# Patient Record
Sex: Female | Born: 1950
Health system: Southern US, Community
[De-identification: ages and names within clinical notes are randomized; demographics above are authoritative.]

## PROBLEM LIST (undated history)

## (undated) DIAGNOSIS — F329 Major depressive disorder, single episode, unspecified: Secondary | ICD-10-CM

## (undated) DIAGNOSIS — J302 Other seasonal allergic rhinitis: Secondary | ICD-10-CM

## (undated) DIAGNOSIS — M5136 Other intervertebral disc degeneration, lumbar region: Secondary | ICD-10-CM

## (undated) DIAGNOSIS — D649 Anemia, unspecified: Secondary | ICD-10-CM

## (undated) DIAGNOSIS — F32A Depression, unspecified: Secondary | ICD-10-CM

## (undated) DIAGNOSIS — K219 Gastro-esophageal reflux disease without esophagitis: Secondary | ICD-10-CM

## (undated) DIAGNOSIS — M810 Age-related osteoporosis without current pathological fracture: Secondary | ICD-10-CM

## (undated) DIAGNOSIS — Z5189 Encounter for other specified aftercare: Secondary | ICD-10-CM

## (undated) DIAGNOSIS — M51369 Other intervertebral disc degeneration, lumbar region without mention of lumbar back pain or lower extremity pain: Secondary | ICD-10-CM

## (undated) DIAGNOSIS — T7840XA Allergy, unspecified, initial encounter: Secondary | ICD-10-CM

## (undated) DIAGNOSIS — E7800 Pure hypercholesterolemia, unspecified: Secondary | ICD-10-CM

## (undated) HISTORY — DX: Allergy, unspecified, initial encounter: T78.40XA

## (undated) HISTORY — DX: Major depressive disorder, single episode, unspecified: F32.9

## (undated) HISTORY — DX: Gastro-esophageal reflux disease without esophagitis: K21.9

## (undated) HISTORY — DX: Age-related osteoporosis without current pathological fracture: M81.0

## (undated) HISTORY — DX: Depression, unspecified: F32.A

## (undated) HISTORY — DX: Encounter for other specified aftercare: Z51.89

## (undated) HISTORY — DX: Anemia, unspecified: D64.9

---

## 1981-02-04 HISTORY — PX: ABDOMINAL HYSTERECTOMY: SHX81

## 2000-01-18 ENCOUNTER — Encounter: Payer: Self-pay | Admitting: Neurosurgery

## 2000-01-18 ENCOUNTER — Ambulatory Visit (HOSPITAL_COMMUNITY): Admission: RE | Admit: 2000-01-18 | Discharge: 2000-01-18 | Payer: Self-pay | Admitting: Neurosurgery

## 2002-02-04 HISTORY — PX: CARPAL TUNNEL RELEASE: SHX101

## 2004-04-07 ENCOUNTER — Emergency Department (HOSPITAL_COMMUNITY): Admission: EM | Admit: 2004-04-07 | Discharge: 2004-04-07 | Payer: Self-pay | Admitting: Emergency Medicine

## 2006-02-04 ENCOUNTER — Emergency Department (HOSPITAL_COMMUNITY): Admission: EM | Admit: 2006-02-04 | Discharge: 2006-02-04 | Payer: Self-pay | Admitting: Emergency Medicine

## 2006-02-10 ENCOUNTER — Encounter: Admission: RE | Admit: 2006-02-10 | Discharge: 2006-02-10 | Payer: Self-pay | Admitting: Family Medicine

## 2006-03-06 ENCOUNTER — Ambulatory Visit: Payer: Self-pay | Admitting: Gastroenterology

## 2006-03-17 ENCOUNTER — Ambulatory Visit: Payer: Self-pay | Admitting: Gastroenterology

## 2006-05-14 ENCOUNTER — Emergency Department (HOSPITAL_COMMUNITY): Admission: EM | Admit: 2006-05-14 | Discharge: 2006-05-14 | Payer: Self-pay | Admitting: Emergency Medicine

## 2006-11-27 ENCOUNTER — Emergency Department (HOSPITAL_COMMUNITY): Admission: EM | Admit: 2006-11-27 | Discharge: 2006-11-27 | Payer: Self-pay | Admitting: Emergency Medicine

## 2007-03-22 ENCOUNTER — Emergency Department (HOSPITAL_COMMUNITY): Admission: EM | Admit: 2007-03-22 | Discharge: 2007-03-22 | Payer: Self-pay | Admitting: Emergency Medicine

## 2007-12-04 ENCOUNTER — Encounter
Admission: RE | Admit: 2007-12-04 | Discharge: 2007-12-04 | Payer: Self-pay | Admitting: Physical Medicine & Rehabilitation

## 2007-12-07 ENCOUNTER — Encounter
Admission: RE | Admit: 2007-12-07 | Discharge: 2008-03-06 | Payer: Self-pay | Admitting: Physical Medicine & Rehabilitation

## 2007-12-07 ENCOUNTER — Ambulatory Visit: Payer: Self-pay | Admitting: Physical Medicine & Rehabilitation

## 2007-12-14 ENCOUNTER — Encounter
Admission: RE | Admit: 2007-12-14 | Discharge: 2007-12-14 | Payer: Self-pay | Admitting: Physical Medicine & Rehabilitation

## 2007-12-25 ENCOUNTER — Encounter: Admission: RE | Admit: 2007-12-25 | Discharge: 2007-12-25 | Payer: Self-pay | Admitting: Family Medicine

## 2008-01-04 ENCOUNTER — Ambulatory Visit: Payer: Self-pay | Admitting: Physical Medicine & Rehabilitation

## 2008-02-15 ENCOUNTER — Ambulatory Visit: Payer: Self-pay | Admitting: Physical Medicine & Rehabilitation

## 2008-03-14 ENCOUNTER — Encounter
Admission: RE | Admit: 2008-03-14 | Discharge: 2008-06-12 | Payer: Self-pay | Admitting: Physical Medicine & Rehabilitation

## 2008-03-15 ENCOUNTER — Ambulatory Visit: Payer: Self-pay | Admitting: Physical Medicine & Rehabilitation

## 2008-04-08 ENCOUNTER — Encounter
Admission: RE | Admit: 2008-04-08 | Discharge: 2008-04-11 | Payer: Self-pay | Admitting: Physical Medicine & Rehabilitation

## 2008-04-11 ENCOUNTER — Ambulatory Visit: Payer: Self-pay | Admitting: Physical Medicine & Rehabilitation

## 2008-07-27 ENCOUNTER — Encounter
Admission: RE | Admit: 2008-07-27 | Discharge: 2008-07-27 | Payer: Self-pay | Admitting: Physical Medicine & Rehabilitation

## 2008-10-14 ENCOUNTER — Encounter
Admission: RE | Admit: 2008-10-14 | Discharge: 2008-10-14 | Payer: Self-pay | Admitting: Physical Medicine & Rehabilitation

## 2009-01-16 ENCOUNTER — Encounter: Admission: RE | Admit: 2009-01-16 | Discharge: 2009-01-16 | Payer: Self-pay | Admitting: Family Medicine

## 2009-04-09 ENCOUNTER — Emergency Department (HOSPITAL_COMMUNITY): Admission: EM | Admit: 2009-04-09 | Discharge: 2009-04-09 | Payer: Self-pay | Admitting: Emergency Medicine

## 2009-05-26 ENCOUNTER — Emergency Department (HOSPITAL_COMMUNITY): Admission: EM | Admit: 2009-05-26 | Discharge: 2009-05-26 | Payer: Self-pay | Admitting: Emergency Medicine

## 2010-02-15 ENCOUNTER — Encounter
Admission: RE | Admit: 2010-02-15 | Discharge: 2010-02-15 | Payer: Self-pay | Source: Home / Self Care | Attending: Family Medicine | Admitting: Family Medicine

## 2010-06-19 NOTE — Procedures (Signed)
NAMEJAYLEANA, Suzanne Vasquez                  ACCOUNT NO.:  [id number]   MEDICAL RECORD NO.:  [id number]           PATIENT TYPE:   LOCATION:                                 FACILITY:   PHYSICIAN:  Erick Colace, M.D.DATE OF BIRTH:  15-Aug-1950   DATE OF PROCEDURE:  01/04/2008  DATE OF DISCHARGE:                               OPERATIVE REPORT   PROCEDURE:  L5-S1 translaminar lumbar epidural steroid injection under  fluoroscopic guidance.   INDICATION:  Lumbar pain with spinal stenosis, degenerative disk  disease.  Most involved levels are L3-L4 and L5-S1.   This is L5-S1 translaminar.  Pain is only partially responsive to  medication management including narcotic analgesics and interferes with  self-care mobility.   Informed consent was obtained after describing the risks and benefits of  the procedure with the patient including bleeding, bruising, infection.  She elects to proceed and has given written consent.  The patient placed  prone on fluoroscopy table.  Betadine prep, sterilely draped.  A 25-  gauge, 1-1/2-inch needle was used to anesthetize the skin and subcu  tissue, 1% lidocaine x2 mL.  Then, an 18-gauge introducer needle was  inserted under fluoroscopic guidance into the L5-S1 interlaminar space.  AP lateral imaging utilized.  Omnipaque 180 x 1 mL demonstrate good  epidural spread followed by injection of 2 mL of 1% MPF lidocaine, 2 mL  of 40 mg/mL Depo-Medrol, and 1 mL of 0.9 normal saline.  The patient  tolerated the procedure well.  Pre-injection pain level 10/10.  Post  injection 0/10.      Erick Colace, M.D.  Electronically Signed     AEK/MEDQ  D:  01/04/2008 16:46:09  T:  01/05/2008 06:04:03  Job:  [id number]

## 2010-06-19 NOTE — Assessment & Plan Note (Signed)
A 60 year old female with 8- to 9-year history of lumbar pain, has not  worked since 2002 due to workers' comp injury.  She had a CT myelogram  showing mild facet hypertrophy L3-4, L4-5, L5-S1.  No evidence of  stenosis.  She has had appropriate UDS.   Her meds include:  1. Lyrica 150 b.i.d.  2. Oxycodone 10/325 nightly.  3. Diazepam 2 mg b.i.d.  4. Cymbalta 60 mg per day.  5. I have placed her on tramadol 50 mg t.i.d.   MRI repeated in December 14, 2007, showed a grade 1 slip L4 and L5.   She has done very well with epidural steroid injection at L5-S1 level.  She indicates that she has actually felt the best she has in many years.  Her gait is improved.  Her pain does still flare-up, some with standing.  Her pain is down to 3-4/10 range.   PHYSICAL EXAMINATION:  VITAL SIGNS:  Her blood pressure is 121/81, pulse  89, respirations 18, O2 sat 100% on room air.  GENERAL:  No acute distress.  Orientation x3.  She is able to toe walk  and heel walk.  BACK:  Her lumbar spine range of motion is 75% forward flexion and 50%  extension.  EXTREMITIES:  Her lower extremity strength is normal.   IMPRESSION:  Lumbar spondylosis with degenerative disk, improved after  epidural steroid injection, is doing well on a combination of tramadol  50 mg t.i.d. and oxycodone 10/325 nightly.   At this point, I cannot predict how long her epidural will last her.  Certainly, I would like to get at least 3 months out of it.  She is  about a month and half into it.  We will check her another month to see  if it is wearing off yet and then reschedule if need be.   Oswestry disability scale today 35%.      Erick Colace, M.D.  Electronically Signed     AEK/MedQ  D:  02/15/2008 10:49:26  T:  02/16/2008 01:27:52  Job #:  [id number]

## 2010-06-19 NOTE — Assessment & Plan Note (Signed)
HISTORY:  A 60 year old female with 8-9 year history of lumbar pain.  She has not worked since 2002 due to Workers Comp injury.  She has had  CT myelogram in the past showing mild facet hypertrophy at L3-L4, L4-L5,  L5-S1 level, but no evidence of central, foraminal, or lateral recess  stenosis.  She has been on methadone in the past and in fact was on it  when we first saw her, but did not show up in her system and Elfriede stated  that it was about a week prior to the test that she last took it.  The  oxycodone was positive as expected as was the metabolite oxymorphone.  No illegal drugs were detected.   CURRENT MEDICATIONS:  1. Lyrica 150 mg b.i.d.  2. Oxycodone 110/325 at night.  3. Diazepam 2 mg b.i.d.  4. Cymbalta 60 mg a day.  5. Fexofenadine 180 mg a day.  6. She has been placed on tramadol 50 mg t.i.d.   MRI performed on December 14, 2007.  This was reviewed with the patient  and this has shown prominent facet joint changes at L4-L5 with a grade 1  slip L4 on L5, moderate bulge resulting in moderate spinal stenosis at  that level.  She had mild facet joint changes at L3-L4 and L5-S1 with  mild stenosis at each of those levels, remaining levels were  unremarkable.  I discussed this result with the patient using a model to  help illustrate.   PHYSICAL EXAMINATION:  GENERAL:  In no acute distress.  Mood and affect  appropriate.  Gait is normal.  Lower extremities have normal strength.  Oriented x3.   IMPRESSION:  Lumbar pain, appears to be a combination of spondylosis  with degenerative disk.   PLAN:  We will continue her tramadol during the day 50 mg t.i.d., start  oxycodone 10/325 mg bedtime, keep her off the methadone.  Two epidural  steroid injection today.      Erick Colace, M.D.  Electronically Signed     AEK/MedQ  D:  01/04/2008 16:49:45  T:  01/05/2008 09:45:53  Job #:  [id number]

## 2010-06-19 NOTE — Procedures (Signed)
Suzanne Vasquez, Suzanne Vasquez                  ACCOUNT NO.:  [id number]   MEDICAL RECORD NO.:  [id number]          PATIENT TYPE:  REC   LOCATION:  [facility]                          FACILITY:  [facility]   PHYSICIAN:  Erick Colace, M.D.DATE OF BIRTH:  15-Feb-1950   DATE OF PROCEDURE:  DATE OF DISCHARGE:                               OPERATIVE REPORT   Procedure is L5-S1 translaminar lumbar epidural steroid injection under  fluoroscopic guidance.   Indication is for lumbar radiculopathy.  Prior good results with similar  injection performed about 3 months ago.   Informed consent was obtained after describing risks and benefits of the  procedure with the patient.  These include bleeding, bruising, and  infection.  She elects to proceed and has given written consent.  The  patient was placed prone on fluoroscopy table.  Betadine prep, sterile  drape.  A 25-gauge inch and a half needle was used to anesthetize the  skin and subcutaneous tissue with 1% lidocaine x2 mL.  Then, an 18-gauge  Tuohy needle was inserted under fluoroscopic guidance in the L5-S1  interlaminar space.  AP and lateral images utilized.  Under lateral  imaging once we got to the posterior elements, switched to loss-of-  resistance technique once again advancing under lateral with constant  plunger pressure obtained good loss of resistance at around 7 cm  followed by injection of 2 mL of Omnipaque 180 under live fluoro  demonstrating good epidural spread followed by injection of 2 mL of 40  mg/mL Depo-Medrol plus 2 mL of 1% MPF lidocaine.  The patient tolerated  the procedure well.  Pre and postinjection vitals stable.  Postinjection  instructions given.  We will see her back in 3 month.  Switched her from  oxycodone to hydrocodone 10 mg nightly, so she does not need to come in  for written prescription, #40 per a month.  She occasionally takes an  extra one at night.  We will continue her tramadol 50 t.i.d.      Erick Colace, M.D.  Electronically Signed     AEK/MEDQ  D:  04/11/2008 16:59:07  T:  04/12/2008 04:13:37  Job:  [id number]   cc:   Tyrone Sage

## 2010-06-19 NOTE — Group Therapy Note (Signed)
CONSULT REQUESTED BY:  Dr. Tyrone Sage.   Consult requested for the evaluation of low back pain.   CHIEF COMPLAINT:  Low back pain.   A 60 year old female with onset of pain sometime in 2001 or 2002.  She  states that she has not worked since 2002 because of a Workers Comp Act  injury.  She states that that claim has been settled.  She had a lumbar  CT myelogram on January 18, 2000, demonstrating mild facet hypertrophy  at L3-L4, L4-L5, L5-S1 levels.  No evidence of central or foraminal or  lateral recess stenosis.  She has had no further advanced imaging  studies in that time.  She has had progression of pain.  Her average  pain is 10/10, constant and aching; interferes with activity at a  moderate-to-severe level.  Her pain is worse with walking and standing,  improves with some stretching exercises as well as some therapy  exercises, but she has obtained these from a friend rather than from a  physical therapist.  Her relief from meds is good.  She can walk 5 to 10  minutes at a time and drive.  She has a goal of coming off of at least  some of her pain medicines because they make her to feel dopey..   CURRENT PAIN MEDICATIONS:  Oxycodone 10/325 one per day, methadone 10 mg  one-half p.o. t.i.d., and Lyrica 150 mg p.o. p.r.n. as well as diazepam  2 mg b.i.d. p.r.n.  She is on Cymbalta 60 mg a day.   Other medications include fexofenadine 180 mg a day.   REVIEW OF SYSTEMS:  Positive for trouble walking, spasms, depression,  and anxiety.   SOCIAL HISTORY:  Single.  Son lives with the patient.   FAMILY HISTORY:  Diabetes and high blood pressure.   HABITS:  Denies smoking or drinking or illegal drug use.   PHYSICAL EXAMINATION:  VITAL SIGNS:  Blood pressure 117/75, pulse 70,  respirations 18, and O2 sat 97% on room air.  GENERAL:  A well-developed, well-nourished female in no acute distress.  Orientation x3.  Affect is alert.  MUSCULOSKELETAL/NEUROLOGIC:  Gait is normal.  She  is able to toe walk  and heel walk.  Extremities without edema.  Coordination in the upper  and lower extremities normal.  Deep tendon reflexes are normal, upper  and lower extremities.  Sensation is normal, upper and lower  extremities.  Motor strength is 5/5 bilaterally at deltoid, biceps,  triceps, grips, as well as hip flexion, knee extension, and ankle  dorsiflexion.  Neck range of motion is full.  Lumbar spine range of  motion is 50% forward flexion and 25% extension.  Extension is more  painful than flexion.  Upper and lower extremity range of motion is  normal.   IMPRESSION:  Low-back pain, axial.  Her last imaging study did show  facet arthropathy.  I do think that lumbosacral spondylosis is likely  etiology of her pain rather than disk given her age and previous  results.  However, given the period of time that has gone by since her  last imaging study would need to repeat prior to any type of lumbar  spine injections.  She did state that she has had good results in the  past with epidural injections, and overall goal is to reduce medications  and increase activity levels after we get her pain under better control  with the use of injections and adjust her medications so that she  may be  able to come off her methadone.  We will send her to some physical  therapy and see if this can optimize her functional status.   The treatment plan will discussed with the patient.  We will need to get  a urine drug screen prior to implementing any narcotic analgesics and  get an opioid consent signed.   Thank you for this interesting consultation.  I will keep you apprised  of her progress.      Erick Colace, M.D.  Electronically Signed     AEK/MedQ  D:  12/07/2007 11:20:04  T:  12/08/2007 01:11:58  Job #:  [id number]

## 2010-06-19 NOTE — Assessment & Plan Note (Signed)
Suzanne Vasquez is a 60 year old female with lumbar pain for 8-9 years.  CT  myelogram showed some facet hypertrophy at L3-4, L4-5, L5-S1.  MRI,  November 2009, showed grade 1 slip L4-5 due to degenerative facet  disease.  Did well with epidural steroid injection L5-S1 performed  January 04, 2008.  She feels like this is now starting to wear off.   MEDICATIONS:  1. Lyrica 150 mg b.i.d.  2. Oxycodone 10/325 at night, has some days where she feels like she      could use another one.  3. Diazepam 2 mg b.i.d. prescribed by a primary care.  4. Cymbalta 60 mg a day.  5. Tramadol 50 t.i.d.  She has had no signs of serotonin syndrome.   PHYSICAL EXAMINATION:  GENERAL:  No acute stress.  Mood and affect  appropriate.  Orientation x3.  She is able to heel walk, toe walk and  has 75% forward flexion and 50% extension lumbar range of motion.  She  has no signs of scoliosis.  Extremities:  Normal strength bilateral  lower.  Deep tendon reflexes are mildly reduced right patellar compared  to the left and equal at the ankles.   IMPRESSION:  1. Lumbar spondylosis due to degenerative disk with radicular symptoms      improved after epidural steroid injection.  We will repeat next      month.  2. We will slightly increase her oxycodone to 40 tablets per month so      some days she can take 2.  3. Continue tramadol 50 p.o. q.i.d.      Erick Colace, M.D.  Electronically Signed     AEK/MedQ  D:  03/15/2008 09:15:11  T:  03/15/2008 11:11:52  Job #:  [id number]

## 2010-07-19 ENCOUNTER — Encounter: Payer: Self-pay | Admitting: Cardiovascular Disease

## 2010-08-06 ENCOUNTER — Emergency Department (HOSPITAL_COMMUNITY)
Admission: EM | Admit: 2010-08-06 | Discharge: 2010-08-06 | Disposition: A | Discharge status: Home / Self Care | Payer: No Typology Code available for payment source | Attending: Emergency Medicine | Admitting: Emergency Medicine

## 2010-08-06 DIAGNOSIS — M545 Low back pain, unspecified: Secondary | ICD-10-CM | POA: Insufficient documentation

## 2010-08-06 DIAGNOSIS — M542 Cervicalgia: Secondary | ICD-10-CM | POA: Insufficient documentation

## 2010-08-06 DIAGNOSIS — R51 Headache: Secondary | ICD-10-CM | POA: Insufficient documentation

## 2010-08-06 DIAGNOSIS — T148XXA Other injury of unspecified body region, initial encounter: Secondary | ICD-10-CM | POA: Insufficient documentation

## 2010-08-06 DIAGNOSIS — IMO0002 Reserved for concepts with insufficient information to code with codable children: Secondary | ICD-10-CM | POA: Insufficient documentation

## 2010-11-14 LAB — URINALYSIS, ROUTINE W REFLEX MICROSCOPIC
Bilirubin Urine: NEGATIVE
Glucose, UA: NEGATIVE
Nitrite: POSITIVE — AB
Protein, ur: 30 — AB
Urobilinogen, UA: 0.2
pH: 5.5

## 2010-11-14 LAB — URINE MICROSCOPIC-ADD ON

## 2011-03-11 ENCOUNTER — Other Ambulatory Visit: Payer: Self-pay | Admitting: Family Medicine

## 2011-03-11 DIAGNOSIS — Z1231 Encounter for screening mammogram for malignant neoplasm of breast: Secondary | ICD-10-CM

## 2011-03-21 ENCOUNTER — Ambulatory Visit
Admission: RE | Admit: 2011-03-21 | Discharge: 2011-03-21 | Disposition: A | Discharge status: Home / Self Care | Payer: Medicare Other | Source: Ambulatory Visit | Attending: Family Medicine | Admitting: Family Medicine

## 2011-03-21 DIAGNOSIS — Z1231 Encounter for screening mammogram for malignant neoplasm of breast: Secondary | ICD-10-CM

## 2012-03-23 ENCOUNTER — Other Ambulatory Visit: Payer: Self-pay | Admitting: Family Medicine

## 2012-03-23 DIAGNOSIS — Z1231 Encounter for screening mammogram for malignant neoplasm of breast: Secondary | ICD-10-CM

## 2012-04-21 ENCOUNTER — Ambulatory Visit: Payer: Medicare Other

## 2012-05-20 ENCOUNTER — Ambulatory Visit
Admission: RE | Admit: 2012-05-20 | Discharge: 2012-05-20 | Disposition: A | Discharge status: Home / Self Care | Payer: Medicare HMO | Source: Ambulatory Visit | Attending: Family Medicine | Admitting: Family Medicine

## 2012-05-20 DIAGNOSIS — Z1231 Encounter for screening mammogram for malignant neoplasm of breast: Secondary | ICD-10-CM

## 2013-01-09 ENCOUNTER — Emergency Department (HOSPITAL_COMMUNITY)
Admission: EM | Admit: 2013-01-09 | Discharge: 2013-01-09 | Disposition: A | Discharge status: Home / Self Care | Payer: Medicare HMO | Attending: Emergency Medicine | Admitting: Emergency Medicine

## 2013-01-09 ENCOUNTER — Encounter (HOSPITAL_COMMUNITY): Payer: Self-pay | Admitting: Emergency Medicine

## 2013-01-09 ENCOUNTER — Emergency Department (HOSPITAL_COMMUNITY): Payer: Medicare HMO

## 2013-01-09 DIAGNOSIS — Z8639 Personal history of other endocrine, nutritional and metabolic disease: Secondary | ICD-10-CM | POA: Insufficient documentation

## 2013-01-09 DIAGNOSIS — R079 Chest pain, unspecified: Secondary | ICD-10-CM

## 2013-01-09 DIAGNOSIS — Z862 Personal history of diseases of the blood and blood-forming organs and certain disorders involving the immune mechanism: Secondary | ICD-10-CM | POA: Insufficient documentation

## 2013-01-09 HISTORY — DX: Pure hypercholesterolemia, unspecified: E78.00

## 2013-01-09 LAB — BASIC METABOLIC PANEL
BUN: 8 mg/dL (ref 6–23)
CO2: 28 mEq/L (ref 19–32)
Chloride: 97 mEq/L (ref 96–112)
Creatinine, Ser: 0.7 mg/dL (ref 0.50–1.10)
GFR calc non Af Amer: >90 mL/min (ref >90)
Glucose, Bld: 123 mg/dL — ABNORMAL HIGH (ref 70–99)

## 2013-01-09 LAB — POCT I-STAT TROPONIN I: Troponin i, poc: 0.01 ng/mL (ref 0.00–0.08)

## 2013-01-09 LAB — CBC
MCH: 26.7 pg (ref 26.0–34.0)
RDW: 13.9 % (ref 11.5–15.5)

## 2013-01-09 MED ORDER — IBUPROFEN 200 MG PO TABS: 400.0000 mg | ORAL_TABLET | Freq: Once | ORAL | Status: DC

## 2013-01-09 NOTE — ED Notes (Signed)
Pt reports developing right rib pain early this morning. Pt reports that she also has had nausea and emesis. Pt denies injury, falling, or a cough. Pt is A/O x4, oxygen saturation is 98% on room air, patient speaking in full sentences, NAD.

## 2013-01-09 NOTE — ED Notes (Signed)
Pt is A&O x3. In no acute distress.  Pt c/o pain 5/10 in right rib cage, denies nausea, states she feels hungry now.

## 2013-01-09 NOTE — ED Provider Notes (Signed)
CSN: [id number]     Arrival date & time 01/09/13  1911 History   First MD Initiated Contact with Patient 01/09/13 2017     Chief Complaint  Patient presents with  . Chest Pain   (Consider location/radiation/quality/duration/timing/severity/associated sxs/prior Treatment) Patient is a 62 y.o. female presenting with chest pain. The history is provided by the patient.  Chest Pain Associated symptoms: no abdominal pain, no back pain, no cough, no fever, no headache, no nausea and no shortness of breath   pt c/o localized pain to small area right lateral chest onset awoke w this morning. Pt states wondered whether may have slept wrong. Pain constant, dull, mild non radiating. No specific exacerbating or alleviating factors. No associated nv, diaphoresis or sob. At rest. No change w activity or exertion. No change w position change, or whether upright or supine. No pleuritic pain. No unusual fatigue or doe. Denies personal or family hx cad. No hx lung disease. Denies personal/fam hx gallstones. Pt denies change w eating. Normal appetite today. No radiation of pain or back pain. Pt states exercises regularly, no symptoms w recent exercise. Pt denies injury or strain to area. No leg pain or swelling. No hx dvt or pe. No recent surgery, travel, trauma, or immobility. Non smoker. No drug use. No cough, uri c/o, or hemoptysis.      Past Medical History  Diagnosis Date  . Hypercholesteremia    Past Surgical History  Procedure Laterality Date  . Abdominal hysterectomy    . Carpal tunnel release     No family history on file. History  Substance Use Topics  . Smoking status: Never Smoker   . Smokeless tobacco: Never Used  . Alcohol Use: No   OB History   Grav Para Term Preterm Abortions TAB SAB Ect Mult Living                 Review of Systems  Constitutional: Negative for fever and chills.  HENT: Negative for sore throat.   Eyes: Negative for redness.  Respiratory: Negative for cough and  shortness of breath.   Cardiovascular: Positive for chest pain.  Gastrointestinal: Negative for nausea and abdominal pain.  Genitourinary: Negative for flank pain.  Musculoskeletal: Negative for back pain and neck pain.  Skin: Negative for rash.  Neurological: Negative for headaches.  Hematological: Does not bruise/bleed easily.  Psychiatric/Behavioral: Negative for confusion.    Allergies  Review of patient's allergies indicates no known allergies.  Home Medications  No current outpatient prescriptions on file. BP 151/90  Pulse 81  Temp(Src) 99.6 F (37.6 C) (Oral)  Resp 14  SpO2 100% Physical Exam  Nursing note and vitals reviewed. Constitutional: She is oriented to person, place, and time. She appears well-developed and well-nourished. No distress.  HENT:  Head: Atraumatic.  Eyes: Conjunctivae are normal. No scleral icterus.  Neck: Neck supple. No tracheal deviation present.  Cardiovascular: Normal rate, regular rhythm, normal heart sounds and intact distal pulses.  Exam reveals no gallop and no friction rub.   No murmur heard. Pulmonary/Chest: Effort normal and breath sounds normal. No respiratory distress. She exhibits no tenderness.  Abdominal: Soft. Normal appearance and bowel sounds are normal. She exhibits no distension and no mass. There is no tenderness. There is no rebound and no guarding.  Genitourinary:  No cva tenderness  Musculoskeletal: Normal range of motion. She exhibits no edema and no tenderness.  Neurological: She is alert and oriented to person, place, and time.  Skin: Skin is warm  and dry. No rash noted.  Psychiatric: She has a normal mood and affect.    ED Course  Procedures (including critical care time)  Results for orders placed during the hospital encounter of 01/09/13  BASIC METABOLIC PANEL      Result Value Range   Sodium 136  135 - 145 mEq/L   Potassium 3.5  3.5 - 5.1 mEq/L   Chloride 97  96 - 112 mEq/L   CO2 28  19 - 32 mEq/L    Glucose, Bld 123 (*) 70 - 99 mg/dL   BUN 8  6 - 23 mg/dL   Creatinine, Ser 1.61  0.50 - 1.10 mg/dL   Calcium 09.6  8.4 - 04.5 mg/dL   GFR calc non Af Amer >90  >90 mL/min   GFR calc Af Amer >90  >90 mL/min  CBC      Result Value Range   WBC 6.7  4.0 - 10.5 K/uL   RBC 5.28 (*) 3.87 - 5.11 MIL/uL   Hemoglobin 14.1  12.0 - 15.0 g/dL   HCT 40.9  81.1 - 91.4 %   MCV 80.1  78.0 - 100.0 fL   MCH 26.7  26.0 - 34.0 pg   MCHC 33.3  30.0 - 36.0 g/dL   RDW 78.2  95.6 - 21.3 %   Platelets 198  150 - 400 K/uL  POCT I-STAT TROPONIN I      Result Value Range   Troponin i, poc 0.01  0.00 - 0.08 ng/mL   Comment 3            Dg Chest 2 View  01/09/2013   CLINICAL DATA:  Chest pain  EXAM: CHEST  2 VIEW  COMPARISON:  05/26/2009  FINDINGS: The heart size and mediastinal contours are within normal limits. Both lungs are clear. The visualized skeletal structures are unremarkable.  IMPRESSION: No active cardiopulmonary disease.   Electronically Signed   By: Natasha Mead M.D.   On: 01/09/2013 20:45      EKG Interpretation    Date/Time:  Saturday January 09 2013 19:41:21 EST Ventricular Rate:  82 PR Interval:  123 QRS Duration: 76 QT Interval:  367 QTC Calculation: 429 R Axis:   52 Text Interpretation:  Sinus rhythm No previous tracing Confirmed by Valory Wetherby  MD, Bryten Maher ([id number]) on 01/09/2013 8:34:50 PM            MDM  Iv ns. Labs. Ecg. Cxr.  Reviewed nursing notes and prior charts for additional history.   After constant symptoms x > 14 hours, trop neg.  Pts pain/symptoms felt not c/w acs.   abd soft nt.  Hr 80, rr 14, pulse ox 99% room air. No pleuritic pain, sob or doe. No fever/cough or infil on cxr.  Pt appears comfortable and stable for d/c.       Suzi Roots, MD 01/09/13 [phone]

## 2013-05-05 ENCOUNTER — Other Ambulatory Visit: Payer: Self-pay

## 2013-05-05 DIAGNOSIS — Z1231 Encounter for screening mammogram for malignant neoplasm of breast: Secondary | ICD-10-CM

## 2013-05-24 ENCOUNTER — Ambulatory Visit
Admission: RE | Admit: 2013-05-24 | Discharge: 2013-05-24 | Disposition: A | Discharge status: Home / Self Care | Payer: Commercial Managed Care - HMO | Source: Ambulatory Visit

## 2013-05-24 DIAGNOSIS — Z1231 Encounter for screening mammogram for malignant neoplasm of breast: Secondary | ICD-10-CM

## 2013-11-19 ENCOUNTER — Other Ambulatory Visit: Payer: Self-pay

## 2014-02-17 ENCOUNTER — Telehealth: Payer: Self-pay | Admitting: *Deleted

## 2014-02-17 NOTE — Telephone Encounter (Signed)
per pt her doctor told her to hold off on PT for now...td

## 2014-02-18 ENCOUNTER — Ambulatory Visit: Payer: Commercial Managed Care - HMO | Admitting: Physical Therapy

## 2014-02-28 DIAGNOSIS — E559 Vitamin D deficiency, unspecified: Secondary | ICD-10-CM | POA: Diagnosis not present

## 2014-02-28 DIAGNOSIS — M5380 Other specified dorsopathies, site unspecified: Secondary | ICD-10-CM | POA: Diagnosis not present

## 2014-02-28 DIAGNOSIS — R7309 Other abnormal glucose: Secondary | ICD-10-CM | POA: Diagnosis not present

## 2014-04-13 ENCOUNTER — Ambulatory Visit: Payer: Commercial Managed Care - HMO | Admitting: Physical Therapy

## 2014-04-25 ENCOUNTER — Ambulatory Visit: Payer: Commercial Managed Care - HMO | Attending: Family Medicine | Admitting: Physical Therapy

## 2014-04-25 DIAGNOSIS — M2569 Stiffness of other specified joint, not elsewhere classified: Secondary | ICD-10-CM

## 2014-04-25 DIAGNOSIS — R29898 Other symptoms and signs involving the musculoskeletal system: Secondary | ICD-10-CM | POA: Insufficient documentation

## 2014-04-25 DIAGNOSIS — M24652 Ankylosis, left hip: Secondary | ICD-10-CM | POA: Diagnosis not present

## 2014-04-25 DIAGNOSIS — M545 Low back pain: Secondary | ICD-10-CM

## 2014-04-25 DIAGNOSIS — M2560 Stiffness of unspecified joint, not elsewhere classified: Secondary | ICD-10-CM

## 2014-04-25 DIAGNOSIS — R269 Unspecified abnormalities of gait and mobility: Secondary | ICD-10-CM | POA: Diagnosis not present

## 2014-04-25 NOTE — Therapy (Addendum)
[place], [place], [address] Phone: [phone]   Fax:  [phone]  Physical Therapy Evaluation  Patient Details  Name: Suzanne Vasquez MRN: [id number] Date of Birth: 01/18/1951 Referring Provider:  Denton Lank, MD  Encounter Date: 04/25/2014      PT End of Session - 04/25/14 1451    Visit Number 1   Number of Visits 16   Date for PT Re-Evaluation 06/25/14   PT Start Time 1330   PT Stop Time 1415   PT Time Calculation (min) 45 min   Activity Tolerance Patient tolerated treatment well   Behavior During Therapy [facility] for tasks assessed/performed      Past Medical History  Diagnosis Date  . Hypercholesteremia     Past Surgical History  Procedure Laterality Date  . Abdominal hysterectomy    . Carpal tunnel release      There were no vitals filed for this visit.  Visit Diagnosis:  Left low back pain, with sciatica presence unspecified - Plan: PT plan of care cert/re-cert  Decreased ROM of trunk and back - Plan: PT plan of care cert/re-cert  Abnormal gait - Plan: PT plan of care cert/re-cert  Decreased range of hip movement, left - Plan: PT plan of care cert/re-cert  G-code: Changing & maintaining Body Position Current status: CL Goal Status: CK      Subjective Assessment - 04/25/14 1334    Symptoms pt is a 64 y.o f with CC of pain going to her left knee and lateral shin pain that is worse at night and occasionally at the day with report of numbness and tingling that has been going on since December with the symptoms getting worse.    Currently in Pain? Yes   Pain Score 4    Pain Location Leg   Pain Orientation Left;Lateral   Pain Descriptors / Indicators Tingling;Aching   Pain Type Chronic pain   Pain Onset More than a month ago   Pain Frequency Intermittent   Aggravating Factors  laying down,    Pain Relieving Factors ibuprofen, oxycodone.    Effect of Pain on Daily Activities no   Multiple Pain Sites No            [facility] PT Assessment - 04/25/14 0001    Assessment   Medical Diagnosis Degenerative disc disease   Onset Date 01/11/14   Next MD Visit no scheduled appointment  make an appointment PRN   Prior Therapy no   Precautions   Precautions None   Precaution Comments pt self limites no lifting over 25   Restrictions   Weight Bearing Restrictions No   Balance Screen   Has the patient fallen in the past 6 months No   [place] Private residence   Living Arrangements Alone   Type of [place] One level   [place] --  exercise bike   Prior Function   Level of Independence Independent with basic ADLs;Independent with homemaking with ambulation;Independent with gait;Independent with transfers   Vocation Part time employment;Works at home   [employer] prolong sitting/ companionships   Leisure watching children activities, exercising   ROM / Strength   AROM / PROM / Strength AROM;Strength   AROM   AROM Assessment Site Lumbar;Hip   Left Hip Flexion 48  48 with SLR, 110 with knee bent   Left Hip ABduction 45   Lumbar Flexion 62   Lumbar Extension  10   Lumbar - Right Side Bend 50%  with symptoms down to the left   Lumbar - Left Side Bend 50%  no pain    Lumbar - Right Rotation 25%   Lumbar - Left Rotation 25%  some soreness at end range   Flexibility   Soft Tissue Assessment /Muscle Length yes   Hamstrings 48  with pain at end range on L   Palpation   Palpation tenderness at the Left posterior hip musculature and  piriformis.    Special Tests    Special Tests Lumbar   Slump test   Findings Positive   Side Left   Straight Leg Raise   Findings Positive   Side  Left   Comment --  ipsilateral pain, negative XSLR   Bed Mobility   Bed Mobility Supine to Sit   Supine to Sit 5: Supervision;Other (comment)  increased time and difficulty due to pain   Ambulation/Gait   Ambulation/Gait  Yes   Gait Pattern Decreased stride length;Antalgic;Narrow base of support;Decreased trunk rotation                   [facility] Adult PT Treatment/Exercise - 04/25/14 0001    Posture/Postural Control   Posture/Postural Control Postural limitations   Postural Limitations Rounded Shoulders;Forward head;Increased lumbar lordosis                PT Education - 04/25/14 1450    Education provided Yes   Education Details evaluation impression, POC, goals, HEP   Person(s) Educated Patient   Methods Explanation   Comprehension Verbalized understanding          PT Short Term Goals - 04/25/14 1630    PT SHORT TERM GOAL #1   Title pt will be I with basic HEP (05/23/2014)   Time 4   Period Weeks   Status New   PT SHORT TERM GOAL #2   Title pt will decrease pain to < 4/10 during and following laying and bed mobility activites to assist with functional progression (05/23/2014)   Time 4   Period Weeks   Status New   PT SHORT TERM GOAL #3   Title pt will be able to achieve functional trunk mobility with <4/10 pain to assist with ADLs (05/23/2014)   Time 4   Period Weeks   Status New   PT SHORT TERM GOAL #4   Title Get FOTO score for low back to assess functional limitations   Time 2   Period Weeks   Status New           PT Long Term Goals - 04/25/14 1634    PT LONG TERM GOAL #1   Title pt will be I with Advanced HEP  (06/06/14)   Time 8   Period Weeks   Status New   PT LONG TERM GOAL #2   Title pt will decrease pain to <2/10 during and following bed mobility and trunk motions to assist with functional transitions (06/06/14)   Time 8   Period Weeks   Status New   PT LONG TERM GOAL #3   Title pt will increase SLR/ hamstring flexibility to > 20 degrees without pain to assist with efficient and safe ambulation. (06/06/14)   Time 8   Period Weeks   Status New   PT LONG TERM GOAL #4   Title pt will be able to verbalize and demonstrate techniques to reduce risk of  reinjury by postural awareness, lifting and carrying mechanics,  pain control, HEP (06/06/14)   Time 8   Period Weeks   Status New               Plan - 04/25/14 1451    Clinical Impression Statement Suzanne Vasquez presents to OPPT with CC of pain and tingling in her left lateral shin. She exhibits limited trunk and hip mobility with hamstring soft tissue restriction with jolting pain at end range. special testing was pos for Straight leg raise with pain on ipsilateral side and slump testing that is consistent with sciatic nerve pathology.  due to increaed pain response todays evaluation was limited. she would benefit from skilled physical therapy to maximize her funcitonal capacity by addressing the defecits listed.    Pt will benefit from skilled therapeutic intervention in order to improve on the following deficits Abnormal gait;Decreased range of motion;Difficulty walking;Postural dysfunction;Decreased endurance;Pain;Increased muscle spasms;Decreased balance;Decreased strength;Decreased mobility;Impaired flexibility;Improper body mechanics   Rehab Potential Good   PT Frequency 2x / week   PT Duration 8 weeks   PT Treatment/Interventions ADLs/Self Care Home Management;Moist Heat;Therapeutic activities;Patient/family education;Passive range of motion;Therapeutic exercise;Ultrasound;Gait training;Manual techniques;Balance training;Cryotherapy;Stair training;Neuromuscular re-education;Electrical Stimulation   PT Next Visit Plan sciatic nerve gliding, assess LE strength and balance, hip stretching and manual STM, modalities for pain, low back FOTO.    PT Home Exercise Plan piriformis stretch, lumbar standing extensions, seated sciatic nerve gliding.    Consulted and Agree with Plan of Care Patient         Problem List There are no active problems to display for this patient.  Starr Lake PT, DPT, LAT, ATC  04/25/2014  4:50 PM   [facility] [address] [place], [place], [address] Phone: [phone]   Fax:  [phone]

## 2014-04-25 NOTE — Patient Instructions (Signed)
   Kinnley Paulson PT, DPT, LAT, ATC  [facility] Phone: [phone]     

## 2014-05-02 ENCOUNTER — Ambulatory Visit: Payer: Commercial Managed Care - HMO | Admitting: Physical Therapy

## 2014-05-09 ENCOUNTER — Ambulatory Visit: Payer: Commercial Managed Care - HMO | Admitting: Physical Therapy

## 2014-05-11 ENCOUNTER — Ambulatory Visit: Payer: Commercial Managed Care - HMO | Attending: Family Medicine | Admitting: Physical Therapy

## 2014-05-11 DIAGNOSIS — M545 Low back pain: Secondary | ICD-10-CM | POA: Insufficient documentation

## 2014-05-11 DIAGNOSIS — R29898 Other symptoms and signs involving the musculoskeletal system: Secondary | ICD-10-CM | POA: Insufficient documentation

## 2014-05-11 DIAGNOSIS — M24652 Ankylosis, left hip: Secondary | ICD-10-CM | POA: Insufficient documentation

## 2014-05-11 DIAGNOSIS — R269 Unspecified abnormalities of gait and mobility: Secondary | ICD-10-CM | POA: Insufficient documentation

## 2014-05-12 DIAGNOSIS — H5210 Myopia, unspecified eye: Secondary | ICD-10-CM | POA: Diagnosis not present

## 2014-05-12 DIAGNOSIS — H5213 Myopia, bilateral: Secondary | ICD-10-CM | POA: Diagnosis not present

## 2014-05-16 ENCOUNTER — Ambulatory Visit: Payer: Commercial Managed Care - HMO | Admitting: Physical Therapy

## 2014-05-18 ENCOUNTER — Ambulatory Visit: Payer: Commercial Managed Care - HMO | Admitting: Physical Therapy

## 2014-05-18 ENCOUNTER — Telehealth: Payer: Self-pay | Admitting: Physical Therapy

## 2014-05-18 NOTE — Telephone Encounter (Signed)
Called Suzanne Vasquez due to 2 cancelled appointments and 3 no shows, and she has no future appointments made. Left a message to call back to schedule more appointments and to discuss attendance policy.

## 2014-05-20 DIAGNOSIS — Z0100 Encounter for examination of eyes and vision without abnormal findings: Secondary | ICD-10-CM | POA: Diagnosis not present

## 2014-05-25 NOTE — Telephone Encounter (Signed)
Pt called back today and said she had been out of town due to a death in the family. Pt wishes to resume and scheduled one visit for May 4th @ 2:15.

## 2014-06-01 ENCOUNTER — Other Ambulatory Visit: Payer: Self-pay

## 2014-06-01 DIAGNOSIS — Z1231 Encounter for screening mammogram for malignant neoplasm of breast: Secondary | ICD-10-CM

## 2014-06-02 ENCOUNTER — Ambulatory Visit
Admission: RE | Admit: 2014-06-02 | Discharge: 2014-06-02 | Disposition: A | Discharge status: Home / Self Care | Payer: Commercial Managed Care - HMO | Source: Ambulatory Visit

## 2014-06-02 ENCOUNTER — Ambulatory Visit: Payer: Commercial Managed Care - HMO

## 2014-06-02 DIAGNOSIS — Z1231 Encounter for screening mammogram for malignant neoplasm of breast: Secondary | ICD-10-CM | POA: Diagnosis not present

## 2014-06-08 ENCOUNTER — Ambulatory Visit: Payer: Commercial Managed Care - HMO | Attending: Family Medicine | Admitting: Physical Therapy

## 2014-06-08 DIAGNOSIS — M24652 Ankylosis, left hip: Secondary | ICD-10-CM | POA: Diagnosis not present

## 2014-06-08 DIAGNOSIS — R29898 Other symptoms and signs involving the musculoskeletal system: Secondary | ICD-10-CM | POA: Diagnosis not present

## 2014-06-08 DIAGNOSIS — M545 Low back pain: Secondary | ICD-10-CM

## 2014-06-08 DIAGNOSIS — R269 Unspecified abnormalities of gait and mobility: Secondary | ICD-10-CM | POA: Insufficient documentation

## 2014-06-08 DIAGNOSIS — M2569 Stiffness of other specified joint, not elsewhere classified: Secondary | ICD-10-CM

## 2014-06-08 DIAGNOSIS — M2560 Stiffness of unspecified joint, not elsewhere classified: Secondary | ICD-10-CM

## 2014-06-08 NOTE — Therapy (Addendum)
[place], [place], [address] Phone: [phone]   Fax:  [phone]  Physical Therapy Treatment  Patient Details  Name: Suzanne Vasquez MRN: [id number] Date of Birth: 1950/04/18 Referring Provider:  Denton Lank, MD  Encounter Date: 06/08/2014      PT End of Session - 06/08/14 1528    Visit Number 2   Number of Visits 8   Date for PT Re-Evaluation 08/03/14   PT Start Time 1415   PT Stop Time 1500   PT Time Calculation (min) 45 min   Activity Tolerance Patient tolerated treatment well   Behavior During Therapy [facility] for tasks assessed/performed      Past Medical History  Diagnosis Date  . Hypercholesteremia     Past Surgical History  Procedure Laterality Date  . Abdominal hysterectomy    . Carpal tunnel release      There were no vitals filed for this visit.  Visit Diagnosis:  Left low back pain, with sciatica presence unspecified - Plan: PT plan of care cert/re-cert  Decreased ROM of trunk and back - Plan: PT plan of care cert/re-cert  Abnormal gait - Plan: PT plan of care cert/re-cert  Decreased range of hip movement, left - Plan: PT plan of care cert/re-cert      Subjective Assessment - 06/08/14 1430    Subjective pt reports that she started out doing her HEP but then gradually stopped doing it. she reports her symptoms are bout the same since last time.    How long can you sit comfortably? unlimited   How long can you stand comfortably? 35 min   How long can you walk comfortably? 45 min (some pain )   Patient Stated Goals to be pain free   Currently in Pain? Yes   Pain Score 7    Pain Location Leg   Pain Orientation Left;Lateral   Pain Descriptors / Indicators Aching;Tingling   Pain Type Chronic pain   Pain Onset More than a month ago   Pain Frequency Constant   Aggravating Factors  laying down, laying on the L side   Pain Relieving Factors pain medication            [facility]  PT Assessment - 06/08/14 1432    Observation/Other Assessments   Lower Extremity Functional Scale  59/81   ROM / Strength   AROM / PROM / Strength AROM;PROM;Strength   AROM   Left Hip Flexion 120   Left Hip ABduction 45   Lumbar Flexion 70   Lumbar Extension 10   Lumbar - Right Side Bend 50%   Lumbar - Left Side Bend 50%   Lumbar - Right Rotation 25%   Lumbar - Left Rotation 25%   PROM   PROM Assessment Site Hip   Right/Left Hip Left   Left Hip Extension 28   Left Hip Flexion 120   Left Hip ABduction 40   Left Hip ADduction 20   Strength   Overall Strength Within functional limits for tasks performed   Flexibility   Hamstrings 48   Slump test   Findings Positive                     [facility] Adult PT Treatment/Exercise - 06/08/14 1439    Lumbar Exercises: Stretches   Passive Hamstring Stretch 2 reps;30 seconds   Lower Trunk Rotation --   Piriformis Stretch 30 seconds;4 reps  pnf technique with 10 sec contract relax  Lumbar Exercises: Seated   Other Seated Lumbar Exercises sciatic nerve gliding with knee flexion and plantarflexion and trunkl flexion and the opposite.   Manual Therapy   Manual Therapy Massage   Massage DTM/ Trigger point release with tennis ball over left gluteal area and piriformis      Clinical judgment G code: changing and maintaining body position Current status: CL Goal Status: CJ           PT Education - 06/08/14 1525    Education provided Yes   Education Details Re-evaluation, updated POC, HEP review, and anatomical explantion.    Person(s) Educated Patient   Methods Explanation   Comprehension Verbalized understanding          PT Short Term Goals - 06/08/14 1537    PT SHORT TERM GOAL #1   Title pt will be I with basic HEP (05/23/2014)   Time 4   Period Weeks   Status Achieved   PT SHORT TERM GOAL #2   Title pt will decrease pain to < 4/10 during and following laying and bed mobility activites to assist with  functional progression (05/23/2014)   Time 4   Period Weeks   Status On-going   PT SHORT TERM GOAL #3   Title pt will be able to achieve functional trunk mobility with <4/10 pain to assist with ADLs (05/23/2014)   Time 4   Period Weeks   Status Achieved           PT Long Term Goals - 06/08/14 1540    PT LONG TERM GOAL #1   Title pt will be I with Advanced HEP  (06/06/14)   Time 8   Period Weeks   Status On-going   PT LONG TERM GOAL #2   Title pt will decrease pain to <2/10 during and following bed mobility and trunk motions to assist with functional transitions (06/06/14)   Time 8   Period Weeks   Status On-going   PT LONG TERM GOAL #3   Title pt will increase SLR/ hamstring flexibility to > 20 degrees without pain to assist with efficient and safe ambulation. (06/06/14)   Time 8   Period Weeks   Status On-going   PT LONG TERM GOAL #4   Title pt will be able to verbalize and demonstrate techniques to reduce risk of reinjury by postural awareness, lifting and carrying mechanics, pain control, HEP (06/06/14)   Time 8   Period Weeks   Status On-going   PT LONG TERM GOAL #5   Title to increase LEF by > 10 points to help with functional capacity    Time 8   Period Weeks   Status New               Plan - 06/08/14 1528    Clinical Impression Statement Suzanne Vasquez presents to OPPT after about 1 month and 1/2. She reports difficulty to attending therapy due to family matters. She has improved her Left hip arom but continues to demonstrate pain at end range with flexion, extension and abduciton. PROM is WNL. She has full functional strength bil with pain noted at end range knee extension. she continues to exhibit a postive test finding with the slump test, and tenderness upon palpation at the L piriformis/gluteal area.  She reported relief sciatic nerve glides,piriformis stretching and DTM/trigger point release, she reported 0/10.   Pt will benefit from skilled therapeutic intervention in  order to improve on the following deficits Abnormal gait;Decreased range of  motion;Difficulty walking;Postural dysfunction;Decreased endurance;Pain;Increased muscle spasms;Decreased balance;Decreased strength;Decreased mobility;Impaired flexibility;Improper body mechanics   Rehab Potential Good   PT Frequency 1x / week   PT Duration 8 weeks   PT Treatment/Interventions ADLs/Self Care Home Management;Moist Heat;Therapeutic activities;Patient/family education;Passive range of motion;Therapeutic exercise;Ultrasound;Gait training;Manual techniques;Balance training;Cryotherapy;Stair training;Neuromuscular re-education;Electrical Stimulation   PT Home Exercise Plan piriformis stretch, lumbar standing extensions, seated sciatic nerve gliding.    Recommended Other Services HEP review and progressed sciatic nerve gliding   Consulted and Agree with Plan of Care Patient        Problem List There are no active problems to display for this patient.  Starr Lake PT, DPT, LAT, ATC  06/08/2014  3:53 PM   [facility] [address] [place], [place], [address] Phone: [phone]   Fax:  [phone]

## 2014-06-08 NOTE — Patient Instructions (Signed)
   Alessandro Griep PT, DPT, LAT, ATC  [facility] Phone: [phone]     

## 2014-06-22 ENCOUNTER — Ambulatory Visit: Payer: Commercial Managed Care - HMO | Admitting: Physical Therapy

## 2014-06-29 ENCOUNTER — Ambulatory Visit: Payer: Commercial Managed Care - HMO | Admitting: Physical Therapy

## 2014-06-29 DIAGNOSIS — M545 Low back pain: Secondary | ICD-10-CM | POA: Diagnosis not present

## 2014-06-29 DIAGNOSIS — R29898 Other symptoms and signs involving the musculoskeletal system: Secondary | ICD-10-CM | POA: Diagnosis not present

## 2014-06-29 DIAGNOSIS — R269 Unspecified abnormalities of gait and mobility: Secondary | ICD-10-CM | POA: Diagnosis not present

## 2014-06-29 DIAGNOSIS — M24652 Ankylosis, left hip: Secondary | ICD-10-CM | POA: Diagnosis not present

## 2014-06-29 NOTE — Therapy (Addendum)
[facility] [address] [place], [place], [address] Phone: [phone]   Fax:  [phone]  Physical Therapy Treatment  Patient Details  Name: Suzanne Vasquez MRN: [id number] Date of Birth: 1950-12-29 Referring Provider:  Hillery Aldo, MD  Encounter Date: 06/29/2014      PT End of Session - 06/29/14 1544    Visit Number 2   Number of Visits 8   Date for PT Re-Evaluation 08/03/14   PT Start Time 1500   PT Stop Time 1557   PT Time Calculation (min) 57 min   Activity Tolerance Patient tolerated treatment well   Behavior During Therapy [facility] for tasks assessed/performed      Past Medical History  Diagnosis Date  . Hypercholesteremia     Past Surgical History  Procedure Laterality Date  . Abdominal hysterectomy    . Carpal tunnel release      There were no vitals filed for this visit.  Visit Diagnosis:  Left low back pain, with sciatica presence unspecified      Subjective Assessment - 06/29/14 1502    Subjective Was able to walk this and, exercise on her bike. .  Walked 45 minutes, rode recumbant and when she got off the bike shr had pain.  No leg pain today.  Leg pain worse at night.   Currently in Pain? Yes   Pain Score 9    Pain Location Back   Pain Orientation Posterior   Pain Descriptors / Indicators Sharp   Aggravating Factors  walking, some activity, Sleeping on Lt.   Pain Relieving Factors Sitting up    Effect of Pain on Daily Activities tries to work throught  the pain   Multiple Pain Sites No                         [facility] Adult PT Treatment/Exercise - 06/29/14 0001    Moist Heat Therapy   Number Minutes Moist Heat 15 Minutes   Moist Heat Location --  low back   Electrical Stimulation   Electrical Stimulation Location --  low back, sidelying RT   Electrical Stimulation Action IFC   Electrical Stimulation Parameters 17.5   Electrical Stimulation Goals Pain   Manual Therapy   Manual  Therapy Soft tissue mobilization   Manual therapy comments able to soften tissue and decrease pain to 3/10                PT Education - 06/29/14 1547    Education provided Yes   Education Details things to try to centralize pain, what to expect with increased pain in low back.  sleeping suggestions.   Person(s) Educated Patient   Methods Explanation   Comprehension Verbalized understanding          PT Short Term Goals - 06/08/14 1537    PT SHORT TERM GOAL #1   Title pt will be I with basic HEP (05/23/2014)   Time 4   Period Weeks   Status Achieved   PT SHORT TERM GOAL #2   Title pt will decrease pain to < 4/10 during and following laying and bed mobility activites to assist with functional progression (05/23/2014)   Time 4   Period Weeks   Status On-going   PT SHORT TERM GOAL #3   Title pt will be able to achieve functional trunk mobility with <4/10 pain to assist with ADLs (05/23/2014)   Time 4   Period Weeks  Status Achieved           PT Long Term Goals - 06/08/14 1540    PT LONG TERM GOAL #1   Title pt will be I with Advanced HEP  (06/06/14)   Time 8   Period Weeks   Status On-going   PT LONG TERM GOAL #2   Title pt will decrease pain to <2/10 during and following bed mobility and trunk motions to assist with functional transitions (06/06/14)   Time 8   Period Weeks   Status On-going   PT LONG TERM GOAL #3   Title pt will increase SLR/ hamstring flexibility to > 20 degrees without pain to assist with efficient and safe ambulation. (06/06/14)   Time 8   Period Weeks   Status On-going   PT LONG TERM GOAL #4   Title pt will be able to verbalize and demonstrate techniques to reduce risk of reinjury by postural awareness, lifting and carrying mechanics, pain control, HEP (06/06/14)   Time 8   Period Weeks   Status On-going   PT LONG TERM GOAL #5   Title to increase LEF by > 10 points to help with functional capacity    Time 8   Period Weeks   Status New                Plan - 06/29/14 1544    Clinical Impression Statement Pain flare.  Progress toward pain goals.  Not sure why pain flared.     PT Next Visit Plan assess manual and modalities.  Posture ed    PT Home Exercise Plan piriformis stretch, lumbar standing extensions, seated sciatic nerve gliding.    Consulted and Agree with Plan of Care Patient        Problem List There are no active problems to display for this patient.   [facility] 06/29/2014, 3:48 PM  [facility] [address] [place], [place], [address] Phone: [phone]   Fax:  [phone]   Melvenia Needles, PTA 06/29/2014 3:48 PM Phone: [phone] Fax: [phone]                 PHYSICAL THERAPY DISCHARGE SUMMARY  Visits from Start of Care: 3  Current functional level related to goals / functional outcomes:  LEFS 59/80   Remaining deficits: See goals   Education / Equipment: HEP handout Plan:                                                    Patient goals were not met. Patient is being discharged due to not returning since the last visit.  ?????       Kristoffer Leamon PT, DPT, LAT, ATC  07/26/2014  1:53 PM

## 2014-06-29 NOTE — Patient Instructions (Signed)
Stop activity if leg pain increases.

## 2014-07-06 ENCOUNTER — Ambulatory Visit: Payer: Commercial Managed Care - HMO | Admitting: Physical Therapy

## 2014-07-13 ENCOUNTER — Ambulatory Visit: Payer: Commercial Managed Care - HMO | Attending: Family Medicine | Admitting: Physical Therapy

## 2014-07-20 ENCOUNTER — Ambulatory Visit: Payer: Commercial Managed Care - HMO | Admitting: Physical Therapy

## 2014-07-21 DIAGNOSIS — M5380 Other specified dorsopathies, site unspecified: Secondary | ICD-10-CM | POA: Diagnosis not present

## 2014-07-21 DIAGNOSIS — H6980 Other specified disorders of Eustachian tube, unspecified ear: Secondary | ICD-10-CM | POA: Diagnosis not present

## 2014-07-21 DIAGNOSIS — J3089 Other allergic rhinitis: Secondary | ICD-10-CM | POA: Diagnosis not present

## 2014-07-25 ENCOUNTER — Emergency Department (HOSPITAL_COMMUNITY)
Admission: EM | Admit: 2014-07-25 | Discharge: 2014-07-25 | Disposition: A | Discharge status: Home / Self Care | Payer: Commercial Managed Care - HMO | Attending: Emergency Medicine | Admitting: Emergency Medicine

## 2014-07-25 ENCOUNTER — Encounter (HOSPITAL_COMMUNITY): Payer: Self-pay

## 2014-07-25 DIAGNOSIS — G8929 Other chronic pain: Secondary | ICD-10-CM | POA: Insufficient documentation

## 2014-07-25 DIAGNOSIS — E780 Pure hypercholesterolemia: Secondary | ICD-10-CM | POA: Insufficient documentation

## 2014-07-25 DIAGNOSIS — Z79899 Other long term (current) drug therapy: Secondary | ICD-10-CM | POA: Insufficient documentation

## 2014-07-25 DIAGNOSIS — M5417 Radiculopathy, lumbosacral region: Secondary | ICD-10-CM

## 2014-07-25 DIAGNOSIS — Z8709 Personal history of other diseases of the respiratory system: Secondary | ICD-10-CM | POA: Diagnosis not present

## 2014-07-25 DIAGNOSIS — M79605 Pain in left leg: Secondary | ICD-10-CM | POA: Diagnosis present

## 2014-07-25 HISTORY — DX: Other intervertebral disc degeneration, lumbar region: M51.36

## 2014-07-25 HISTORY — DX: Other seasonal allergic rhinitis: J30.2

## 2014-07-25 HISTORY — DX: Other intervertebral disc degeneration, lumbar region without mention of lumbar back pain or lower extremity pain: M51.369

## 2014-07-25 MED ORDER — MELOXICAM 7.5 MG PO TABS: 7.5000 mg | ORAL_TABLET | Freq: Every day | ORAL | Status: DC

## 2014-07-25 MED ORDER — DEXAMETHASONE SODIUM PHOSPHATE 10 MG/ML IJ SOLN
10.0000 mg | Freq: Once | INTRAMUSCULAR | Status: AC
  Administered 2014-07-25: 10 mg via INTRAMUSCULAR
  Filled 2014-07-25: qty 1

## 2014-07-25 MED ORDER — PREDNISONE 20 MG PO TABS: 40.0000 mg | ORAL_TABLET | Freq: Every day | ORAL | Status: DC

## 2014-07-25 NOTE — Discharge Instructions (Signed)
Prednisone as prescribed until all gone. mobic for inflammation. Do not take any ibuprofen or aleve while taking this medication. Continue stretches and exercises. Follow up with your doctor for further evaluation.   Lumbosacral Radiculopathy Lumbosacral radiculopathy is a pinched nerve or nerves in the low back (lumbosacral area). When this happens you may have weakness in your legs and may not be able to stand on your toes. You may have pain going down into your legs. There may be difficulties with walking normally. There are many causes of this problem. Sometimes this may happen from an injury, or simply from arthritis or boney problems. It may also be caused by other illnesses such as diabetes. If there is no improvement after treatment, further studies may be done to find the exact cause. DIAGNOSIS  X-rays may be needed if the problems become long standing. Electromyograms may be done. This study is one in which the working of nerves and muscles is studied. HOME CARE INSTRUCTIONS   Applications of ice packs may be helpful. Ice can be used in a plastic bag with a towel around it to prevent frostbite to skin. This may be used every 2 hours for 20 to 30 minutes, or as needed, while awake, or as directed by your caregiver.  Only take over-the-counter or prescription medicines for pain, discomfort, or fever as directed by your caregiver.  If physical therapy was prescribed, follow your caregiver's directions. SEEK IMMEDIATE MEDICAL CARE IF:   You have pain not controlled with medications.  You seem to be getting worse rather than better.  You develop increasing weakness in your legs.  You develop loss of bowel or bladder control.  You have difficulty with walking or balance, or develop clumsiness in the use of your legs.  You have a fever. MAKE SURE YOU:   Understand these instructions.  Will watch your condition.  Will get help right away if you are not doing well or get  worse. Document Released: 01/21/2005 Document Revised: 04/15/2011 Document Reviewed: 09/11/2007 [facility] Patient Information 2015 [place], [place]. This information is not intended to replace advice given to you by your health care provider. Make sure you discuss any questions you have with your health care provider.

## 2014-07-25 NOTE — ED Notes (Signed)
Pt c/o L lower back pain shooting down LLE  x "1.5 weeks or longer."  Pain score 10/10. Denies injury. Hx of back problems.  Pt has not taken anything for pain.

## 2014-07-25 NOTE — ED Provider Notes (Signed)
CSN: [id number]     Arrival date & time 07/25/14  1151 History  This chart was scribed for non-physician practitioner, Jeannett Senior, PA-C working with Ernestina Patches, MD by Tula Nakayama, ED scribe. This patient was seen in room WTR6/WTR6 and the patient's care was started at 12:32 PM   Chief Complaint  Patient presents with  . Leg Pain   The history is provided by the patient. No language interpreter was used.    HPI Comments: Suzanne Vasquez is a 64 y.o. female who presents to the Emergency Department complaining of an intermittent, gradually worsening, moderate exacerbation of her chronic lower back pain that radiates to her left leg and started a month ago. She reports pain becomes worse with turning, exercising and bearing weight. Pt has tried Percocet 10-325 mg and Ibuprofen with no relief. She also is treated with physical therapy weekly for her left lower back pain with sciatica. Pt is currently being followed by Dr. Posey Pronto for her symptoms. She denies numbness and tingling of her feet, bladder or bowel incontinence and fever as associated symptoms.  Past Medical History  Diagnosis Date  . Hypercholesteremia   . Seasonal allergies   . Degenerative disc disease, lumbar    Past Surgical History  Procedure Laterality Date  . Abdominal hysterectomy    . Carpal tunnel release     History reviewed. No pertinent family history. History  Substance Use Topics  . Smoking status: Never Smoker   . Smokeless tobacco: Never Used  . Alcohol Use: No   OB History    No data available     Review of Systems  Constitutional: Negative for fever.  Musculoskeletal: Positive for back pain and arthralgias.  Neurological: Negative for numbness.   Allergies  Review of patient's allergies indicates no known allergies.  Home Medications   Prior to Admission medications   Medication Sig Start Date End Date Taking? Authorizing Provider  fluticasone (FLONASE) 50 MCG/ACT nasal spray Place 2  sprays into both nostrils every 6 (six) hours as needed for allergies.  05/17/14  Yes Historical Provider, MD  oxyCODONE-acetaminophen (PERCOCET) 10-325 MG per tablet Take 1 tablet by mouth every 8 (eight) hours as needed for pain.   Yes Historical Provider, MD  simvastatin (ZOCOR) 20 MG tablet Take 20 mg by mouth every morning.    Yes Historical Provider, MD   BP 121/97 mmHg  Pulse 95  Temp(Src) 98.2 F (36.8 C) (Oral)  Resp 18  SpO2 100% Physical Exam  Constitutional: She is oriented to person, place, and time. She appears well-developed and well-nourished. No distress.  HENT:  Head: Normocephalic and atraumatic.  Eyes: Conjunctivae and EOM are normal.  Neck: Neck supple. No tracheal deviation present.  Cardiovascular: Normal rate.   Pulmonary/Chest: Effort normal. No respiratory distress.  Musculoskeletal: She exhibits no edema.  No midline lumbar spine tenderness. No ttp to perispinal muscles. Pain with left straight leg raise. Normal appearing left leg, normal left knee and ankle exam. No calf pain or tenderness. Negative homans sign. dp pulses intact and equal bilaterally.  Neurological: She is alert and oriented to person, place, and time.  5/5 and equal lower extremity strength. 2+ and equal patellar reflexes bilaterally. Pt able to dorsiflex bilateral toes and feet with good strength against resistance. Equal sensation bilaterally over thighs and lower legs.   Skin: Skin is warm and dry.  Psychiatric: She has a normal mood and affect. Her behavior is normal.  Nursing note and vitals reviewed.  ED Course  Procedures   DIAGNOSTIC STUDIES: Oxygen Saturation is 100% on RA, normal by my interpretation.    COORDINATION OF CARE: 12:37 PM Discussed suspicion for nerve pain and treatment plan with pt which includes prednisone, Mobic and follow-up with Dr. Posey Pronto for possible MRI. Pt agreed to plan.   Labs Review Labs Reviewed - No data to display  Imaging Review No results  found.   EKG Interpretation None      MDM   Final diagnoses:  Lumbosacral radiculopathy    Pt with lower back pain that appears to be chronic in nature. Has had injections, currently doing PT for it, and on percocet 10mg s. Pt here because of pain and left leg pain that she states is new for the last month. No signs of cauda equina based on exam and hx. No findings to require emergent tx or imaging. Prednisone for inflammation, decadron given in ED. Follow up with pcp. Return precautions discussed.   Filed Vitals:   07/25/14 1159  BP: 121/97  Pulse: 95  Temp: 98.2 F (36.8 C)  Resp: 18   I personally performed the services described in this documentation, which was scribed in my presence. The recorded information has been reviewed and is accurate.      Jeannett Senior, PA-C 07/25/14 1302  Ernestina Patches, MD 07/27/14 1123

## 2014-08-01 ENCOUNTER — Other Ambulatory Visit: Payer: Self-pay

## 2014-08-04 DIAGNOSIS — Z0000 Encounter for general adult medical examination without abnormal findings: Secondary | ICD-10-CM | POA: Diagnosis not present

## 2014-08-04 DIAGNOSIS — Z1389 Encounter for screening for other disorder: Secondary | ICD-10-CM | POA: Diagnosis not present

## 2014-08-04 DIAGNOSIS — H6980 Other specified disorders of Eustachian tube, unspecified ear: Secondary | ICD-10-CM | POA: Diagnosis not present

## 2014-08-04 DIAGNOSIS — G894 Chronic pain syndrome: Secondary | ICD-10-CM | POA: Diagnosis not present

## 2014-08-10 DIAGNOSIS — Z1211 Encounter for screening for malignant neoplasm of colon: Secondary | ICD-10-CM | POA: Diagnosis not present

## 2014-08-19 DIAGNOSIS — G894 Chronic pain syndrome: Secondary | ICD-10-CM | POA: Diagnosis not present

## 2014-11-18 DIAGNOSIS — R7309 Other abnormal glucose: Secondary | ICD-10-CM | POA: Diagnosis not present

## 2014-11-18 DIAGNOSIS — E559 Vitamin D deficiency, unspecified: Secondary | ICD-10-CM | POA: Diagnosis not present

## 2014-11-18 DIAGNOSIS — G894 Chronic pain syndrome: Secondary | ICD-10-CM | POA: Diagnosis not present

## 2015-01-02 DIAGNOSIS — J209 Acute bronchitis, unspecified: Secondary | ICD-10-CM | POA: Diagnosis not present

## 2015-01-02 DIAGNOSIS — J019 Acute sinusitis, unspecified: Secondary | ICD-10-CM | POA: Diagnosis not present

## 2015-03-08 DIAGNOSIS — E785 Hyperlipidemia, unspecified: Secondary | ICD-10-CM | POA: Diagnosis not present

## 2015-03-08 DIAGNOSIS — G8929 Other chronic pain: Secondary | ICD-10-CM | POA: Diagnosis not present

## 2015-03-08 DIAGNOSIS — Z0000 Encounter for general adult medical examination without abnormal findings: Secondary | ICD-10-CM | POA: Diagnosis not present

## 2015-03-08 DIAGNOSIS — M5136 Other intervertebral disc degeneration, lumbar region: Secondary | ICD-10-CM | POA: Diagnosis not present

## 2015-03-08 DIAGNOSIS — R7309 Other abnormal glucose: Secondary | ICD-10-CM | POA: Diagnosis not present

## 2015-03-08 DIAGNOSIS — R7303 Prediabetes: Secondary | ICD-10-CM | POA: Diagnosis not present

## 2015-03-08 DIAGNOSIS — E559 Vitamin D deficiency, unspecified: Secondary | ICD-10-CM | POA: Diagnosis not present

## 2015-03-08 DIAGNOSIS — J309 Allergic rhinitis, unspecified: Secondary | ICD-10-CM | POA: Diagnosis not present

## 2015-03-28 DIAGNOSIS — Z23 Encounter for immunization: Secondary | ICD-10-CM | POA: Diagnosis not present

## 2015-05-10 DIAGNOSIS — H5203 Hypermetropia, bilateral: Secondary | ICD-10-CM | POA: Diagnosis not present

## 2015-05-10 DIAGNOSIS — H5210 Myopia, unspecified eye: Secondary | ICD-10-CM | POA: Diagnosis not present

## 2015-05-25 ENCOUNTER — Other Ambulatory Visit: Payer: Self-pay

## 2015-05-25 ENCOUNTER — Other Ambulatory Visit: Payer: Self-pay | Admitting: Family Medicine

## 2015-05-25 DIAGNOSIS — R5381 Other malaise: Secondary | ICD-10-CM

## 2015-05-25 DIAGNOSIS — Z1231 Encounter for screening mammogram for malignant neoplasm of breast: Secondary | ICD-10-CM

## 2015-05-26 DIAGNOSIS — H40003 Preglaucoma, unspecified, bilateral: Secondary | ICD-10-CM | POA: Diagnosis not present

## 2015-06-09 ENCOUNTER — Other Ambulatory Visit: Payer: Self-pay | Admitting: Family Medicine

## 2015-06-09 DIAGNOSIS — E2839 Other primary ovarian failure: Secondary | ICD-10-CM

## 2015-06-12 ENCOUNTER — Ambulatory Visit
Admission: RE | Admit: 2015-06-12 | Discharge: 2015-06-12 | Disposition: A | Discharge status: Home / Self Care | Payer: Commercial Managed Care - HMO | Source: Ambulatory Visit

## 2015-06-12 ENCOUNTER — Ambulatory Visit
Admission: RE | Admit: 2015-06-12 | Discharge: 2015-06-12 | Disposition: A | Discharge status: Home / Self Care | Payer: Commercial Managed Care - HMO | Source: Ambulatory Visit | Attending: Family Medicine | Admitting: Family Medicine

## 2015-06-12 ENCOUNTER — Ambulatory Visit: Payer: Commercial Managed Care - HMO

## 2015-06-12 DIAGNOSIS — M810 Age-related osteoporosis without current pathological fracture: Secondary | ICD-10-CM | POA: Diagnosis not present

## 2015-06-12 DIAGNOSIS — Z780 Asymptomatic menopausal state: Secondary | ICD-10-CM | POA: Diagnosis not present

## 2015-06-12 DIAGNOSIS — E2839 Other primary ovarian failure: Secondary | ICD-10-CM

## 2015-06-12 DIAGNOSIS — Z1231 Encounter for screening mammogram for malignant neoplasm of breast: Secondary | ICD-10-CM | POA: Diagnosis not present

## 2015-10-16 DIAGNOSIS — G8929 Other chronic pain: Secondary | ICD-10-CM | POA: Diagnosis not present

## 2015-10-16 DIAGNOSIS — M5136 Other intervertebral disc degeneration, lumbar region: Secondary | ICD-10-CM | POA: Diagnosis not present

## 2015-10-16 DIAGNOSIS — E785 Hyperlipidemia, unspecified: Secondary | ICD-10-CM | POA: Diagnosis not present

## 2015-10-16 DIAGNOSIS — J309 Allergic rhinitis, unspecified: Secondary | ICD-10-CM | POA: Diagnosis not present

## 2015-10-16 DIAGNOSIS — R7303 Prediabetes: Secondary | ICD-10-CM | POA: Diagnosis not present

## 2015-10-25 DIAGNOSIS — Z0100 Encounter for examination of eyes and vision without abnormal findings: Secondary | ICD-10-CM | POA: Diagnosis not present

## 2015-10-30 DIAGNOSIS — R7301 Impaired fasting glucose: Secondary | ICD-10-CM | POA: Diagnosis not present

## 2015-10-30 DIAGNOSIS — E785 Hyperlipidemia, unspecified: Secondary | ICD-10-CM | POA: Diagnosis not present

## 2015-10-30 DIAGNOSIS — Z23 Encounter for immunization: Secondary | ICD-10-CM | POA: Diagnosis not present

## 2016-02-19 ENCOUNTER — Encounter: Payer: Self-pay | Admitting: Gastroenterology

## 2016-04-24 ENCOUNTER — Encounter: Payer: Self-pay | Admitting: Gastroenterology

## 2016-05-24 ENCOUNTER — Ambulatory Visit (AMBULATORY_SURGERY_CENTER): Payer: Self-pay | Admitting: *Deleted

## 2016-05-24 VITALS — Ht 65.5 in | Wt 171.6 lb

## 2016-05-24 DIAGNOSIS — Z1211 Encounter for screening for malignant neoplasm of colon: Secondary | ICD-10-CM

## 2016-05-24 NOTE — Progress Notes (Signed)
Pt denies allergies to eggs or soy products. Denies difficulty with sedation or anesthesia. Denies any diet or weight loss medications. Denies use of supplemental oxygen.  Emmi instructions given for procedure.  

## 2016-05-27 MED ORDER — NA SULFATE-K SULFATE-MG SULF 17.5-3.13-1.6 GM/177ML PO SOLN: ORAL | 0 refills | Status: DC

## 2016-05-31 ENCOUNTER — Encounter: Payer: Self-pay | Admitting: Gastroenterology

## 2016-06-13 ENCOUNTER — Telehealth: Payer: Self-pay | Admitting: Gastroenterology

## 2016-06-13 NOTE — Telephone Encounter (Signed)
Ok, thanks.

## 2016-06-14 ENCOUNTER — Encounter: Payer: Commercial Managed Care - HMO | Admitting: Gastroenterology

## 2016-06-26 ENCOUNTER — Other Ambulatory Visit: Payer: Self-pay | Admitting: Physician Assistant

## 2016-06-26 DIAGNOSIS — M5136 Other intervertebral disc degeneration, lumbar region: Secondary | ICD-10-CM

## 2016-06-26 DIAGNOSIS — G8929 Other chronic pain: Secondary | ICD-10-CM

## 2016-07-05 ENCOUNTER — Encounter: Payer: Self-pay | Admitting: Gastroenterology

## 2016-07-05 ENCOUNTER — Ambulatory Visit (AMBULATORY_SURGERY_CENTER): Payer: Medicare HMO | Admitting: Gastroenterology

## 2016-07-05 VITALS — BP 110/13 | HR 93 | Temp 97.7°F | Resp 18 | Ht 65.5 in | Wt 171.0 lb

## 2016-07-05 DIAGNOSIS — Z1212 Encounter for screening for malignant neoplasm of rectum: Secondary | ICD-10-CM

## 2016-07-05 DIAGNOSIS — D125 Benign neoplasm of sigmoid colon: Secondary | ICD-10-CM

## 2016-07-05 DIAGNOSIS — Z1211 Encounter for screening for malignant neoplasm of colon: Secondary | ICD-10-CM

## 2016-07-05 DIAGNOSIS — E669 Obesity, unspecified: Secondary | ICD-10-CM | POA: Diagnosis not present

## 2016-07-05 MED ORDER — SODIUM CHLORIDE 0.9 % IV SOLN: 500.0000 mL | INTRAVENOUS | Status: DC

## 2016-07-05 NOTE — Progress Notes (Signed)
Report to PACU, RN, vss, BBS= Clear.  

## 2016-07-05 NOTE — Op Note (Signed)
[facility] Patient Name: Suzanne Vasquez Procedure Date: 07/05/2016 3:03 PM MRN: [id number] Endoscopist: Milus Banister , MD Age: 66 Referring MD:  Date of Birth: 04-Feb-1951 Gender: Female Account #: [id number] Procedure:                Colonoscopy Indications:              Screening for colorectal malignant neoplasm Medicines:                Monitored Anesthesia Care Procedure:                Pre-Anesthesia Assessment:                           - Prior to the procedure, a History and Physical                            was performed, and patient medications and                            allergies were reviewed. The patient's tolerance of                            previous anesthesia was also reviewed. The risks                            and benefits of the procedure and the sedation                            options and risks were discussed with the patient.                            All questions were answered, and informed consent                            was obtained. Prior Anticoagulants: The patient has                            taken no previous anticoagulant or antiplatelet                            agents. ASA Grade Assessment: II - A patient with                            mild systemic disease. After reviewing the risks                            and benefits, the patient was deemed in                            satisfactory condition to undergo the procedure.                           After obtaining informed consent, the colonoscope  was passed under direct vision. Throughout the                            procedure, the patient's blood pressure, pulse, and                            oxygen saturations were monitored continuously. The                            Model CF-HQ190L [id number]) scope was introduced                            through the anus and advanced to the the cecum,                            identified by  appendiceal orifice and ileocecal                            valve. The colonoscopy was performed without                            difficulty. The patient tolerated the procedure                            well. The quality of the bowel preparation was                            excellent. The ileocecal valve, appendiceal                            orifice, and rectum were photographed. Scope In: 3:12:34 PM Scope Out: 3:23:30 PM Scope Withdrawal Time: 0 hours 8 minutes 50 seconds  Total Procedure Duration: 0 hours 10 minutes 56 seconds  Findings:                 A 7 mm polyp was found in the sigmoid colon. The                            polyp was sessile. The polyp was removed with a                            cold snare. Resection and retrieval were complete.                           The exam was otherwise without abnormality on                            direct and retroflexion views. Complications:            No immediate complications. Estimated blood loss:                            None. Estimated Blood Loss:     Estimated blood loss: none. Impression:               -  One 6 mm polyp in the sigmoid colon, removed with                            a cold snare. Resected and retrieved.                           - The examination was otherwise normal on direct                            and retroflexion views. Recommendation:           - Patient has a contact number available for                            emergencies. The signs and symptoms of potential                            delayed complications were discussed with the                            patient. Return to normal activities tomorrow.                            Written discharge instructions were provided to the                            patient.                           - Resume previous diet.                           - Continue present medications.                           You will receive a letter within 2-3  weeks with the                            pathology results and my final recommendations.                           If the polyp(s) is proven to be 'pre-cancerous' on                            pathology, you will need repeat colonoscopy in 5                            years. If the polyp(s) is NOT 'precancerous' on                            pathology then you should repeat colon cancer                            screening in 10 years with colonoscopy without need  for colon cancer screening by any method prior to                            then (including stool testing). Milus Banister, MD 07/05/2016 3:27:59 PM This report has been signed electronically.

## 2016-07-05 NOTE — Patient Instructions (Signed)
YOU HAD AN ENDOSCOPIC PROCEDURE TODAY AT [facility]:   Refer to the procedure report that was given to you for any specific questions about what was found during the examination.  If the procedure report does not answer your questions, please call your gastroenterologist to clarify.  If you requested that your care partner not be given the details of your procedure findings, then the procedure report has been included in a sealed envelope for you to review at your convenience later.  YOU SHOULD EXPECT: Some feelings of bloating in the abdomen. Passage of more gas than usual.  Walking can help get rid of the air that was put into your GI tract during the procedure and reduce the bloating. If you had a lower endoscopy (such as a colonoscopy or flexible sigmoidoscopy) you may notice spotting of blood in your stool or on the toilet paper. If you underwent a bowel prep for your procedure, you may not have a normal bowel movement for a few days.  Please Note:  You might notice some irritation and congestion in your nose or some drainage.  This is from the oxygen used during your procedure.  There is no need for concern and it should clear up in a day or so.  SYMPTOMS TO REPORT IMMEDIATELY:   Following lower endoscopy (colonoscopy or flexible sigmoidoscopy):  Excessive amounts of blood in the stool  Significant tenderness or worsening of abdominal pains  Swelling of the abdomen that is new, acute  Fever of 100F or higher  Please read all handouts given to you by your healthcare needs today. For urgent or emergent issues, a gastroenterologist can be reached at any hour by calling [phone].   DIET:  We do recommend a small meal at first, but then you may proceed to your regular diet.  Drink plenty of fluids but you should avoid alcoholic beverages for 24 hours.  ACTIVITY:  You should plan to take it easy for the rest of today and you should NOT DRIVE or use heavy machinery until  tomorrow (because of the sedation medicines used during the test).    FOLLOW UP: Our staff will call the number listed on your records the next business day following your procedure to check on you and address any questions or concerns that you may have regarding the information given to you following your procedure. If we do not reach you, we will leave a message.  However, if you are feeling well and you are not experiencing any problems, there is no need to return our call.  We will assume that you have returned to your regular daily activities without incident.  If any biopsies were taken you will be contacted by phone or by letter within the next 1-3 weeks.  Please call us at [phone] if you have not heard about the biopsies in 3 weeks.    SIGNATURES/CONFIDENTIALITY: You and/or your care partner have signed paperwork which will be entered into your electronic medical record.  These signatures attest to the fact that that the information above on your After Visit Summary has been reviewed and is understood.  Full responsibility of the confidentiality of this discharge information lies with you and/or your care-partner.  Thank you for letting us take care of your healthcare needs today.

## 2016-07-05 NOTE — Progress Notes (Signed)
Called to room to assist during endoscopic procedure.  Patient ID and intended procedure confirmed with present staff. Received instructions for my participation in the procedure from the performing physician.  

## 2016-07-08 ENCOUNTER — Other Ambulatory Visit: Payer: Self-pay | Admitting: Physician Assistant

## 2016-07-08 ENCOUNTER — Ambulatory Visit
Admission: RE | Admit: 2016-07-08 | Discharge: 2016-07-08 | Disposition: A | Discharge status: Home / Self Care | Payer: Medicare HMO | Source: Ambulatory Visit | Attending: Physician Assistant | Admitting: Physician Assistant

## 2016-07-08 ENCOUNTER — Telehealth: Payer: Self-pay | Admitting: *Deleted

## 2016-07-08 DIAGNOSIS — M5136 Other intervertebral disc degeneration, lumbar region: Secondary | ICD-10-CM

## 2016-07-08 DIAGNOSIS — G8929 Other chronic pain: Secondary | ICD-10-CM

## 2016-07-08 DIAGNOSIS — M48061 Spinal stenosis, lumbar region without neurogenic claudication: Secondary | ICD-10-CM | POA: Diagnosis not present

## 2016-07-08 DIAGNOSIS — Z1231 Encounter for screening mammogram for malignant neoplasm of breast: Secondary | ICD-10-CM

## 2016-07-08 NOTE — Telephone Encounter (Signed)
Message left

## 2016-07-08 NOTE — Telephone Encounter (Signed)
  Follow up Call-  Call back number 07/05/2016  Post procedure Call Back phone  # [phone]  Permission to leave phone message Yes  Some recent data might be hidden     Patient questions:  Do you have a fever, pain , or abdominal swelling? No. Pain Score  0 *  Have you tolerated food without any problems? Yes.    Have you been able to return to your normal activities? Yes.    Do you have any questions about your discharge instructions: Diet   No. Medications  No. Follow up visit  No.  Do you have questions or concerns about your Care? No.  Actions: * If pain score is 4 or above: No action needed, pain <4.

## 2016-07-13 ENCOUNTER — Encounter: Payer: Self-pay | Admitting: Gastroenterology

## 2016-07-22 ENCOUNTER — Ambulatory Visit: Payer: Medicare HMO

## 2016-07-30 ENCOUNTER — Ambulatory Visit
Admission: RE | Admit: 2016-07-30 | Discharge: 2016-07-30 | Disposition: A | Discharge status: Home / Self Care | Payer: Medicare HMO | Source: Ambulatory Visit | Attending: Physician Assistant | Admitting: Physician Assistant

## 2016-07-30 DIAGNOSIS — Z1231 Encounter for screening mammogram for malignant neoplasm of breast: Secondary | ICD-10-CM | POA: Diagnosis not present

## 2016-08-06 DIAGNOSIS — E785 Hyperlipidemia, unspecified: Secondary | ICD-10-CM | POA: Diagnosis not present

## 2016-08-06 DIAGNOSIS — R7303 Prediabetes: Secondary | ICD-10-CM | POA: Diagnosis not present

## 2016-09-27 ENCOUNTER — Ambulatory Visit: Payer: Medicare HMO | Admitting: Physical Medicine & Rehabilitation

## 2016-10-09 DIAGNOSIS — Z111 Encounter for screening for respiratory tuberculosis: Secondary | ICD-10-CM | POA: Diagnosis not present

## 2016-10-11 DIAGNOSIS — Z0100 Encounter for examination of eyes and vision without abnormal findings: Secondary | ICD-10-CM | POA: Diagnosis not present

## 2016-10-14 ENCOUNTER — Encounter: Payer: Self-pay | Admitting: Physical Medicine & Rehabilitation

## 2016-10-14 ENCOUNTER — Encounter: Payer: Medicare HMO | Attending: Physical Medicine & Rehabilitation

## 2016-10-14 ENCOUNTER — Ambulatory Visit (HOSPITAL_BASED_OUTPATIENT_CLINIC_OR_DEPARTMENT_OTHER): Payer: Medicare HMO | Admitting: Physical Medicine & Rehabilitation

## 2016-10-14 VITALS — BP 140/92 | HR 74 | Resp 14

## 2016-10-14 DIAGNOSIS — K219 Gastro-esophageal reflux disease without esophagitis: Secondary | ICD-10-CM | POA: Diagnosis not present

## 2016-10-14 DIAGNOSIS — Z5181 Encounter for therapeutic drug level monitoring: Secondary | ICD-10-CM | POA: Insufficient documentation

## 2016-10-14 DIAGNOSIS — M4316 Spondylolisthesis, lumbar region: Secondary | ICD-10-CM | POA: Insufficient documentation

## 2016-10-14 DIAGNOSIS — M47816 Spondylosis without myelopathy or radiculopathy, lumbar region: Secondary | ICD-10-CM | POA: Diagnosis not present

## 2016-10-14 DIAGNOSIS — R7303 Prediabetes: Secondary | ICD-10-CM | POA: Insufficient documentation

## 2016-10-14 DIAGNOSIS — Z79899 Other long term (current) drug therapy: Secondary | ICD-10-CM | POA: Diagnosis not present

## 2016-10-14 DIAGNOSIS — M810 Age-related osteoporosis without current pathological fracture: Secondary | ICD-10-CM | POA: Insufficient documentation

## 2016-10-14 DIAGNOSIS — E7800 Pure hypercholesterolemia, unspecified: Secondary | ICD-10-CM | POA: Diagnosis not present

## 2016-10-14 DIAGNOSIS — G894 Chronic pain syndrome: Secondary | ICD-10-CM

## 2016-10-14 DIAGNOSIS — F329 Major depressive disorder, single episode, unspecified: Secondary | ICD-10-CM | POA: Insufficient documentation

## 2016-10-14 NOTE — Progress Notes (Signed)
Subjective:    Patient ID: Suzanne Vasquez, female    DOB: 02-09-1950, 66 y.o.   MRN: [id number]  HPI CC Low back pain 99% of pain is in back Original back injury while transferring a pt in 2001 She has been seen at a pain clinic at [facility], she had a neurosurgery visit in 2001, with Dr. Annette Stable   CT myelogram 2001 mild facet arthropathy multilevel lumbar   I first evaluated the patient on 12/07/2007, patient was on methadone 5mg  TID and Oxycodone 10mg  daily, Cymbalta 60mg  per day, Lyrica 150mg    My last visit was 04/23/2008, and she was down to hydrocodone 10 mg daily at bedtime in addition to tramadol 50 mg 3 times a day  At that time she was also receiving L5-S1 epidural injections  Over the last 8 years her dosing has escalated by her primary care physician and is currently taking oxycodone 10 mg 5 times per day. Patient is asking if this is too much and we discussed that I would not recommend any more than 3 per day.   Walks for exercise Exercise bike at home Work full time as a home health aide Also does stretching  "borderline diabetic"  Last physical therapy visits 2016, which reportedly were not very helpful  Repeat lumbar MRI 07/08/2016, results as below, we looked at her films, looked at a spine model  Pain Inventory Average Pain 9 Pain Right Now 9 My pain is sharp  In the last 24 hours, has pain interfered with the following? General activity 8 Relation with others 0 Enjoyment of life 8 What TIME of day is your pain at its worst? morning, daytime, evening Sleep (in general) Fair  Pain is worse with: walking, standing and some activites Pain improves with: medication Relief from Meds: 10  Mobility walk without assistance how many minutes can you walk? 30 ability to climb steps?  yes do you drive?  yes transfers alone Do you have any goals in this area?  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  no  Neuro/Psych tingling trouble walking  Prior Studies new visit MRI LUMBAR SPINE WITHOUT CONTRAST  TECHNIQUE: Multiplanar, multisequence MR imaging of the lumbar spine was performed. No intravenous contrast was administered.  COMPARISON:  Lumbar MRI 12/14/2007  FINDINGS: Segmentation: Lumbar segmentation appears to be normal which is the same numbering system used on the 2009 MRI.  Alignment: Grade 1 anterolisthesis at L4-L5 was subtle in 2009 and has progressed, now measuring up to 7 or 8 mm. Progression also of retrolisthesis of L5 on S1. Normal vertebral height and alignment elsewhere.  Vertebrae: Degenerative endplate marrow changes have developed at L5-S1. No marrow edema or evidence of acute osseous abnormality. Negative visible sacrum.  Conus medullaris: Extends to the L1-L2 level and appears normal.  Paraspinal and other soft tissues: Negative.  Disc levels:  T11-T12:  Mild facet hypertrophy.  T12-L1:  Negative.  L1-L2:  Mild facet hypertrophy.  L2-L3: Mild facet hypertrophy. Mild right foraminal disc bulge and endplate spurring. Borderline to mild spinal and right L2 foraminal stenosis.  L3-L4: Mild circumferential but mostly far lateral disc bulging. Mild to moderate facet and ligament flavum hypertrophy. Borderline to mild spinal stenosis.  L4-L5: Grade 1 anterolisthesis and decreased anterior disc space since 2009. Moderate to severe facet and ligament flavum hypertrophy, although facet joint fluid has resolved progressed and now severe spinal and bilateral lateral  recess (descending L5 nerve root levels) stenosis (series 6, image 24). No convincing foraminal stenosis.  L5-S1: Retrolisthesis with severe disc space loss since 2009. Probable vacuum disc. Increased circumferential disc osteophyte complex. There is a small caudal disc extrusion in the midline where an annular fissure was noted in 2009 (series 2, image 7  and series 6, image 31). Mild to moderate facet and ligament flavum hypertrophy has not significantly changed. Mild spinal stenosis has not significantly changed although mild to moderate bilateral lateral recess stenosis has progressed (descending S1 nerve root levels). New mild bilateral L5 foraminal stenosis.  IMPRESSION: 1. Progressed grade 1 anterolisthesis at L4-L5 and retrolisthesis at L5-S1 since 2009 with progressed disc and posterior element degeneration at both levels. Severe disc degeneration now at L5-S1. 2. Associated increased severe spinal and bilateral lateral recess stenosis at L4-L5. Mild spinal stenosis at L5-S1 is not significantly changed, but mild to moderate bilateral lateral recess and mild bilateral foraminal stenosis at that level is new. 3. Increased lumbar disc bulging and facet hypertrophy elsewhere with borderline to mild spinal stenosis now at L2-L3 and L3-L4.   Electronically Signed   By: Genevie Ann M.D.   On: 07/08/2016 08:44 Physicians involved in your care new visit   Family History  Problem Relation Age of Onset  . Diabetes Mother   . Heart failure Mother   . Diabetes Brother   . Heart disease Brother   . Colon cancer Neg Hx   . Breast cancer Neg Hx    Social History   Social History  . Marital status: Single    Spouse name: N/A  . Number of children: N/A  . Years of education: N/A   Social History Main Topics  . Smoking status: Never Smoker  . Smokeless tobacco: Never Used  . Alcohol use No  . Drug use: No  . Sexual activity: Not Asked   Other Topics Concern  . None   Social History Narrative  . None   Past Surgical History:  Procedure Laterality Date  . ABDOMINAL HYSTERECTOMY    . CARPAL TUNNEL RELEASE     Past Medical History:  Diagnosis Date  . Allergy    seasonal  . Anemia   . Blood transfusion without reported diagnosis    about "40" years ago  . Degenerative disc disease, lumbar   . Depression   . GERD  (gastroesophageal reflux disease)   . Hypercholesteremia   . Osteoporosis   . Seasonal allergies    BP (!) 140/92 (BP Location: Left Arm, Patient Position: Sitting, Cuff Size: Normal)   Pulse 74   Resp 14   SpO2 96%   Opioid Risk Score:   Fall Risk Score:  `1  Depression screen PHQ 2/9  Depression screen PHQ 2/9 10/14/2016  Decreased Interest 0  Down, Depressed, Hopeless 0  PHQ - 2 Score 0  Altered sleeping 0  Tired, decreased energy 2  Change in appetite 2  Feeling bad or failure about yourself  0  Trouble concentrating 0  Moving slowly or fidgety/restless 0  Suicidal thoughts 0  PHQ-9 Score 4  Difficult doing work/chores Somewhat difficult    Review of Systems  Constitutional: Negative.   HENT: Negative.   Eyes: Negative.   Respiratory: Negative.   Cardiovascular: Negative.   Gastrointestinal: Negative.   Endocrine: Negative.   Musculoskeletal: Positive for back pain and gait problem.  Skin: Negative.   Allergic/Immunologic: Negative.   Neurological:       Tingling  Hematological: Negative.   All other systems reviewed and are negative.      Objective:   Physical Exam  Constitutional: She is oriented to person, place, and time. She appears well-developed and well-nourished.  HENT:  Head: Normocephalic and atraumatic.  Eyes: Pupils are equal, round, and reactive to light. Conjunctivae and EOM are normal.  Neck: Normal range of motion.  Cardiovascular: Normal rate and regular rhythm.   Pulmonary/Chest: Effort normal and breath sounds normal. No respiratory distress.  Abdominal: Soft. Bowel sounds are normal. She exhibits no distension.  Musculoskeletal:  No tenderness to palpation in the lumbar paraspinals above L4 from L4-S1. She does have mild paraspinal tenderness to palpation. Lumbar range of motion reduced with flexion, extension, lateral bending and rotation approximately 25% normal in each direction. She has pain with both forward flexion as well as  with extension. Straight leg raising test is negative  Neurological: She is alert and oriented to person, place, and time. Gait abnormal.  Reflex Scores:      Tricep reflexes are 1+ on the right side and 1+ on the left side.      Bicep reflexes are 1+ on the right side and 1+ on the left side.      Brachioradialis reflexes are 1+ on the right side and 1+ on the left side.      Patellar reflexes are 2+ on the right side and 2+ on the left side.      Achilles reflexes are 2+ on the right side and 2+ on the left side. Psychiatric: She has a normal mood and affect.  Nursing note and vitals reviewed.  Motor strength is 5/5 bilateral deltoid, biceps, triceps, grip, hip flexor, knee extensor, ankle dorsi flexors, plantar flexor  Sensation intact bilateral upper and lower limbs to light touch and pinprick              1. Lumbar spondylosis without myelopathy. She has evidence of stenosis at bilateral L4-5 lateral recess, but no radicular pain. She has had some progression since 2009. Her spondylolisthesis has slightly worsened. check a buccal swab, if negative for illicit drugs or nonprescribed opioids, may take over her oxycodone 10mg  prescription but is discussed with patient will reduce from 150 tablets per month to 90 tablets per month.  The patient wishes to avoid surgery and we discussed that a conservative measures are not helpful that she may benefit from surgical stabilization as well as decompression.  We also discussed medial branch blocks and radiofrequency neurotomy, literature has been provided for information.  If she gets improvement with interventional pain procedures may be able to reduce her opiates further potentially to schedule III medications.

## 2016-10-14 NOTE — Patient Instructions (Signed)
If urine drug screen looks okay, would be able to provide a prescription for oxycodone 10 mg, #90 for one month  The procedure, we discussed was medial branch blocks and if this provided a good short-term relief of your back pain. We can do medial branch neurotomy which can provide a 6-12 month relief

## 2016-10-19 LAB — DRUG TOX METHYLPHEN W/CONF,ORAL FLD: Methylphenidate: NEGATIVE ng/mL (ref <1.0)

## 2016-10-19 LAB — DRUG TOX MONITOR 1 W/CONF, ORAL FLD
AMPHETAMINES: NEGATIVE ng/mL (ref <10)
BARBITURATES: NEGATIVE ng/mL (ref <10)
Benzodiazepines: NEGATIVE ng/mL (ref <0.50)
Buprenorphine: NEGATIVE ng/mL (ref <0.025)
Cocaine: NEGATIVE ng/mL (ref <2.5)
FENTANYL: NEGATIVE ng/mL (ref <0.10)
HEROIN METABOLITE: NEGATIVE ng/mL (ref <1.0)
MARIJUANA: NEGATIVE ng/mL (ref <2.5)
MDMA: NEGATIVE ng/mL (ref <10)
Meperidine: NEGATIVE ng/mL (ref <5.0)
Meprobamate: NEGATIVE ng/mL (ref <2.5)
Methadone: NEGATIVE ng/mL (ref <5.0)
NICOTINE METABOLITE: NEGATIVE ng/mL (ref <5.0)
Opiates: NEGATIVE ng/mL (ref <2.5)
PROPOXYPHENE: NEGATIVE ng/mL (ref <5.0)
Phencyclidine: NEGATIVE ng/mL (ref <10)
TRAMADOL: NEGATIVE ng/mL (ref <5.0)
Tapentadol: NEGATIVE ng/mL (ref <5.0)
Zolpidem: NEGATIVE ng/mL (ref <5.0)

## 2016-10-19 LAB — DRUG TOX ALC METAB W/CON, ORAL FLD: ALCOHOL METABOLITE: NEGATIVE ng/mL (ref <25)

## 2016-10-23 ENCOUNTER — Telehealth: Payer: Self-pay | Admitting: *Deleted

## 2016-10-23 NOTE — Telephone Encounter (Signed)
Oral swab drug screen was consistent for having no medication present.  Last dose of oxycodone acetaminophen reported was 10/07/16 and test was performed 10/14/16.

## 2016-11-05 ENCOUNTER — Telehealth: Payer: Self-pay | Admitting: *Deleted

## 2016-11-05 DIAGNOSIS — R3915 Urgency of urination: Secondary | ICD-10-CM | POA: Diagnosis not present

## 2016-11-05 NOTE — Telephone Encounter (Signed)
She has appt with you on Monday 11/11/16. Note put on schedule to sign CSA.

## 2016-11-05 NOTE — Telephone Encounter (Signed)
-----   Message from Charlett Blake, MD sent at 11/05/2016  9:09 AM EDT ----- Looks appropriate, if patient needs another prescription of oxycodone may prescribe 10 mg 3 times a day, a schedule with Zella Ball and make sure we get the controlled substance agreement documented as well as BMP. Review

## 2016-11-11 ENCOUNTER — Encounter: Payer: Self-pay | Admitting: Physical Medicine & Rehabilitation

## 2016-11-11 ENCOUNTER — Encounter: Payer: Medicare HMO | Attending: Physical Medicine & Rehabilitation

## 2016-11-11 ENCOUNTER — Ambulatory Visit (HOSPITAL_BASED_OUTPATIENT_CLINIC_OR_DEPARTMENT_OTHER): Payer: Medicare HMO | Admitting: Physical Medicine & Rehabilitation

## 2016-11-11 VITALS — BP 131/87 | HR 90 | Resp 14

## 2016-11-11 DIAGNOSIS — Z79899 Other long term (current) drug therapy: Secondary | ICD-10-CM | POA: Diagnosis not present

## 2016-11-11 DIAGNOSIS — M810 Age-related osteoporosis without current pathological fracture: Secondary | ICD-10-CM | POA: Diagnosis not present

## 2016-11-11 DIAGNOSIS — M4316 Spondylolisthesis, lumbar region: Secondary | ICD-10-CM | POA: Diagnosis not present

## 2016-11-11 DIAGNOSIS — G894 Chronic pain syndrome: Secondary | ICD-10-CM | POA: Insufficient documentation

## 2016-11-11 DIAGNOSIS — F329 Major depressive disorder, single episode, unspecified: Secondary | ICD-10-CM | POA: Insufficient documentation

## 2016-11-11 DIAGNOSIS — R7303 Prediabetes: Secondary | ICD-10-CM | POA: Diagnosis not present

## 2016-11-11 DIAGNOSIS — E7800 Pure hypercholesterolemia, unspecified: Secondary | ICD-10-CM | POA: Diagnosis not present

## 2016-11-11 DIAGNOSIS — M47816 Spondylosis without myelopathy or radiculopathy, lumbar region: Secondary | ICD-10-CM | POA: Diagnosis not present

## 2016-11-11 DIAGNOSIS — K219 Gastro-esophageal reflux disease without esophagitis: Secondary | ICD-10-CM | POA: Diagnosis not present

## 2016-11-11 DIAGNOSIS — Z5181 Encounter for therapeutic drug level monitoring: Secondary | ICD-10-CM | POA: Insufficient documentation

## 2016-11-11 MED ORDER — DIAZEPAM 10 MG PO TABS: 10.0000 mg | ORAL_TABLET | Freq: Once | ORAL | 3 refills | Status: AC

## 2016-11-11 NOTE — Progress Notes (Signed)
Subjective:    Patient ID: Suzanne Vasquez, female    DOB: Sep 06, 1950, 66 y.o.   MRN: [id number]  HPI  Patient states that she has not been taking any narcotic analgesics for the last several months, she weaned herself off "cold [place]"  Patient desires RF treatment- we discussed the process of first doing medial branch blocks on 2 occasions to demonstrate at least temporary improvement of greater than 50%.  Ibuprofen 200mg  3 tabs only every other night, asking if this is too much Pain Inventory Average Pain 9 Pain Right Now 9 My pain is sharp and aching  In the last 24 hours, has pain interfered with the following? General activity 0 Relation with others 0 Enjoyment of life 0 What TIME of day is your pain at its worst? morning, evening, night Sleep (in general) Fair  Pain is worse with: walking, bending, standing and some activites Pain improves with: nothing Relief from Meds: 0  Mobility walk without assistance ability to climb steps?  yes do you drive?  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  no  Physicians involved in your care Any changes since last visit?  no   Family History  Problem Relation Age of Onset  . Diabetes Mother   . Heart failure Mother   . Diabetes Brother   . Heart disease Brother   . Colon cancer Neg Hx   . Breast cancer Neg Hx    Social History   Social History  . Marital status: Single    Spouse name: N/A  . Number of children: N/A  . Years of education: N/A   Social History Main Topics  . Smoking status: Never Smoker  . Smokeless tobacco: Never Used  . Alcohol use No  . Drug use: No  . Sexual activity: Not Asked   Other Topics Concern  . None   Social History Narrative  . None   Past Surgical History:  Procedure Laterality Date  . ABDOMINAL HYSTERECTOMY    . CARPAL TUNNEL RELEASE      Past Medical History:  Diagnosis Date  . Allergy    seasonal  . Anemia   . Blood transfusion without reported diagnosis    about "40" years ago  . Degenerative disc disease, lumbar   . Depression   . GERD (gastroesophageal reflux disease)   . Hypercholesteremia   . Osteoporosis   . Seasonal allergies    BP 131/87   Pulse 90   Resp 14   SpO2 99%   Opioid Risk Score:   Fall Risk Score:  `1  Depression screen PHQ 2/9  Depression screen PHQ 2/9 10/14/2016  Decreased Interest 0  Down, Depressed, Hopeless 0  PHQ - 2 Score 0  Altered sleeping 0  Tired, decreased energy 2  Change in appetite 2  Feeling bad or failure about yourself  0  Trouble concentrating 0  Moving slowly or fidgety/restless 0  Suicidal thoughts 0  PHQ-9 Score 4  Difficult doing work/chores Somewhat difficult    Review of Systems  Constitutional: Negative.   HENT: Negative.   Eyes: Negative.   Respiratory: Negative.   Cardiovascular: Negative.   Gastrointestinal: Negative.   Endocrine: Negative.   Genitourinary: Negative.   Musculoskeletal: Positive for back pain.  Skin: Negative.   Allergic/Immunologic: Negative.   Neurological: Negative.   Hematological: Negative.  Psychiatric/Behavioral: Negative.   All other systems reviewed and are negative.      Objective:   Physical Exam  Constitutional: She is oriented to person, place, and time. She appears well-developed and well-nourished.  HENT:  Head: Normocephalic and atraumatic.  Eyes: Pupils are equal, round, and reactive to light. Conjunctivae and EOM are normal.  Neck: Normal range of motion.  Musculoskeletal:       Lumbar back: She exhibits decreased range of motion. She exhibits no tenderness and no deformity.  Lumbar range of motion is diminished, has 50% flexion, extension, lateral bending and rotation    Neurological: She is alert and oriented to person, place, and time. She has normal strength. Gait normal.  5/5 bilateral  deltoid, biceps, triceps, grip, hip flexor, knee extensor, ankle dorsiflexor  Psychiatric: She has a normal mood and affect.  Nursing note and vitals reviewed.         Assessment & Plan:  1. Lumbar spondylosis without myelopathy. She has primarily axial pain. She did have some improvement in the past with epidural injections, which lasted several months. We discussed that since her primary pathology is in the axial lumbar area and her imaging studies also demonstrate lumbar spondylosis, that she would be a good candidate for lumbar medial branch blocks. We discussed that the effects of these blocks will be temporary, although the duration of effect is difficult to predict. We are looking for, at least 50% temporary reduction in her typical lumbar pain. If this occurs on 2 occasions she would be a good candidate for radiofrequency neurotomy.. In the meantime, she can continue her over-the-counter ibuprofen with food, in addition, would recommend starting some lumbar stretching and strengthening exercises, have given patient a printout. We'll try to schedule this procedure later this week.

## 2016-11-11 NOTE — Patient Instructions (Addendum)
We will do 2 sets of lumbar medial branch blocks and if you have a positive result, we will do radiofrequency on the right side than on the left side  Also, I would recommend starting to do some lumbar exercises. I can print some for you and you can also do some walking or bicycle riding  May take ibuprofen 600-800 mg 2 or 3 times a day with food   Back Exercises The following exercises strengthen the muscles that help to support the back. They also help to keep the lower back flexible. Doing these exercises can help to prevent back pain or lessen existing pain. If you have back pain or discomfort, try doing these exercises 2-3 times each day or as told by your health care provider. When the pain goes away, do them once each day, but increase the number of times that you repeat the steps for each exercise (do more repetitions). If you do not have back pain or discomfort, do these exercises once each day or as told by your health care provider. Exercises Single Knee to Chest  Repeat these steps 3-5 times for each leg: 1. Lie on your back on a firm bed or the floor with your legs extended. 2. Bring one knee to your chest. Your other leg should stay extended and in contact with the floor. 3. Hold your knee in place by grabbing your knee or thigh. 4. Pull on your knee until you feel a gentle stretch in your lower back. 5. Hold the stretch for 10-30 seconds. 6. Slowly release and straighten your leg.  Pelvic Tilt  Repeat these steps 5-10 times: 1. Lie on your back on a firm bed or the floor with your legs extended. 2. Bend your knees so they are pointing toward the ceiling and your feet are flat on the floor. 3. Tighten your lower abdominal muscles to press your lower back against the floor. This motion will tilt your pelvis so your tailbone points up toward the ceiling instead of pointing to your feet or the floor. 4. With gentle tension and even breathing, hold this position for 5-10  seconds.  Cat-Cow  Repeat these steps until your lower back becomes more flexible: 1. Get into a hands-and-knees position on a firm surface. Keep your hands under your shoulders, and keep your knees under your hips. You may place padding under your knees for comfort. 2. Let your head hang down, and point your tailbone toward the floor so your lower back becomes rounded like the back of a cat. 3. Hold this position for 5 seconds. 4. Slowly lift your head and point your tailbone up toward the ceiling so your back forms a sagging arch like the back of a cow. 5. Hold this position for 5 seconds.  Press-Ups  Repeat these steps 5-10 times: 1. Lie on your abdomen (face-down) on the floor. 2. Place your palms near your head, about shoulder-width apart. 3. While you keep your back as relaxed as possible and keep your hips on the floor, slowly straighten your arms to raise the top half of your body and lift your shoulders. Do not use your back muscles to raise your upper torso. You may adjust the placement of your hands to make yourself more comfortable. 4. Hold this position for 5 seconds while you keep your back relaxed. 5. Slowly return to lying flat on the floor.  Bridges  Repeat these steps 10 times: 1. Lie on your back on a firm surface.  2. Bend your knees so they are pointing toward the ceiling and your feet are flat on the floor. 3. Tighten your buttocks muscles and lift your buttocks off of the floor until your waist is at almost the same height as your knees. You should feel the muscles working in your buttocks and the back of your thighs. If you do not feel these muscles, slide your feet 1-2 inches farther away from your buttocks. 4. Hold this position for 3-5 seconds. 5. Slowly lower your hips to the starting position, and allow your buttocks muscles to relax completely.  If this exercise is too easy, try doing it with your arms crossed over your chest. Abdominal Crunches  Repeat  these steps 5-10 times: 1. Lie on your back on a firm bed or the floor with your legs extended. 2. Bend your knees so they are pointing toward the ceiling and your feet are flat on the floor. 3. Cross your arms over your chest. 4. Tip your chin slightly toward your chest without bending your neck. 5. Tighten your abdominal muscles and slowly raise your trunk (torso) high enough to lift your shoulder blades a tiny bit off of the floor. Avoid raising your torso higher than that, because it can put too much stress on your low back and it does not help to strengthen your abdominal muscles. 6. Slowly return to your starting position.  Back Lifts Repeat these steps 5-10 times: 1. Lie on your abdomen (face-down) with your arms at your sides, and rest your forehead on the floor. 2. Tighten the muscles in your legs and your buttocks. 3. Slowly lift your chest off of the floor while you keep your hips pressed to the floor. Keep the back of your head in line with the curve in your back. Your eyes should be looking at the floor. 4. Hold this position for 3-5 seconds. 5. Slowly return to your starting position.  Contact a health care provider if:  Your back pain or discomfort gets much worse when you do an exercise.  Your back pain or discomfort does not lessen within 2 hours after you exercise. If you have any of these problems, stop doing these exercises right away. Do not do them again unless your health care provider says that you can. Get help right away if:  You develop sudden, severe back pain. If this happens, stop doing the exercises right away. Do not do them again unless your health care provider says that you can. This information is not intended to replace advice given to you by your health care provider. Make sure you discuss any questions you have with your health care provider. Document Released: 02/29/2004 Document Revised: 05/31/2015 Document Reviewed: 03/17/2014 Elsevier Interactive  Patient Education  2017 [employer].

## 2016-11-15 ENCOUNTER — Encounter: Payer: Self-pay | Admitting: Physical Medicine & Rehabilitation

## 2016-11-15 ENCOUNTER — Ambulatory Visit (HOSPITAL_BASED_OUTPATIENT_CLINIC_OR_DEPARTMENT_OTHER): Payer: Medicare HMO | Admitting: Physical Medicine & Rehabilitation

## 2016-11-15 VITALS — BP 154/97 | HR 83

## 2016-11-15 DIAGNOSIS — Z79899 Other long term (current) drug therapy: Secondary | ICD-10-CM | POA: Diagnosis not present

## 2016-11-15 DIAGNOSIS — M47816 Spondylosis without myelopathy or radiculopathy, lumbar region: Secondary | ICD-10-CM | POA: Diagnosis not present

## 2016-11-15 DIAGNOSIS — F329 Major depressive disorder, single episode, unspecified: Secondary | ICD-10-CM | POA: Diagnosis not present

## 2016-11-15 DIAGNOSIS — M4316 Spondylolisthesis, lumbar region: Secondary | ICD-10-CM | POA: Diagnosis not present

## 2016-11-15 DIAGNOSIS — K219 Gastro-esophageal reflux disease without esophagitis: Secondary | ICD-10-CM | POA: Diagnosis not present

## 2016-11-15 DIAGNOSIS — R7303 Prediabetes: Secondary | ICD-10-CM | POA: Diagnosis not present

## 2016-11-15 DIAGNOSIS — G894 Chronic pain syndrome: Secondary | ICD-10-CM | POA: Diagnosis not present

## 2016-11-15 DIAGNOSIS — E7800 Pure hypercholesterolemia, unspecified: Secondary | ICD-10-CM | POA: Diagnosis not present

## 2016-11-15 DIAGNOSIS — Z5181 Encounter for therapeutic drug level monitoring: Secondary | ICD-10-CM | POA: Diagnosis not present

## 2016-11-15 MED ORDER — DIAZEPAM 5 MG PO TABS: 5.0000 mg | ORAL_TABLET | Freq: Once | ORAL | 0 refills | Status: AC

## 2016-11-15 NOTE — Patient Instructions (Signed)

## 2016-11-15 NOTE — Progress Notes (Signed)

## 2016-11-15 NOTE — Progress Notes (Signed)
  PROCEDURE RECORD [facility]   Name: Suzanne Vasquez DOB:1950/05/16 MRN: [id number]  Date:11/15/2016  Physician: Alysia Penna, MD    Nurse/CMA: Bright CMA  Allergies: No Active Allergies  Consent Signed: Yes.    Is patient diabetic? No.  CBG today?   Pregnant: No. LMP: No LMP recorded. Patient has had a hysterectomy. (age 66-55)  Anticoagulants: no Anti-inflammatory: no Antibiotics: no  Procedure: bilateral medial branch blocks  Position: Prone   **has taken valium  Start Time: 1027am End Time: 1040am  Fluoro Time: 53s  RN/CMA Takeem Krotzer RN Bright CMA    Time 945 1045    BP 154/97 156/107    Pulse 83 73    Respirations 14 16    O2 Sat 97 97    S/S 6 6    Pain Level 9/10 5/10     D/C home with Suzanne Vasquez, patient A & O X 3, D/C instructions reviewed, and sits independently.

## 2016-11-21 ENCOUNTER — Telehealth: Payer: Self-pay | Admitting: Physical Medicine & Rehabilitation

## 2016-11-21 NOTE — Telephone Encounter (Signed)
RECD FAX FROM [facility] REQUEST CLINICAL NOTES BY 1 PM [phone]- SENT 3 MONTHS OF CLINICALS AND RECEIVED FAX SUCCESSFUL AT 11:56 AM TODAY - RECEVIED VOICEMAIL AT 3:02 10.17 AND AT 3:02 1.18 REQ MED RECDS CALLED DENISE AND ADVISED THEY WERE FAXED TIMELY - SHE STATES SHE DOESN'T HAVE THEM

## 2016-11-25 ENCOUNTER — Telehealth: Payer: Self-pay | Admitting: Physical Medicine & Rehabilitation

## 2016-11-25 NOTE — Telephone Encounter (Signed)
INS [place] REQ PEER TO PEER WITH DR Letta Pate HE SPOKE WITH DR MINA - PER DR K - PHYSICIAN ASKED THAT I FAX PROGRESS NOTES TO HER AGAIN (ALREADY DID IT LAST WEEK TIMELY FOR THE PRIOR AUTH - THEY STATE THEY NEVER GOT) I REFAXED THIS TIME TO NURSE DENISE MERCANDETTI [phone]  FAX SUCCESSFUL AGAIN  DR. MEAN IS AT [phone]

## 2016-12-10 ENCOUNTER — Ambulatory Visit: Payer: Medicare HMO | Admitting: Physical Medicine & Rehabilitation

## 2016-12-12 ENCOUNTER — Encounter: Payer: Medicare HMO | Attending: Physical Medicine & Rehabilitation

## 2016-12-12 ENCOUNTER — Encounter: Payer: Self-pay | Admitting: Physical Medicine & Rehabilitation

## 2016-12-12 ENCOUNTER — Other Ambulatory Visit: Payer: Self-pay

## 2016-12-12 ENCOUNTER — Ambulatory Visit: Payer: Medicare HMO | Admitting: Physical Medicine & Rehabilitation

## 2016-12-12 VITALS — BP 146/89 | HR 90 | Resp 14

## 2016-12-12 DIAGNOSIS — M47816 Spondylosis without myelopathy or radiculopathy, lumbar region: Secondary | ICD-10-CM

## 2016-12-12 DIAGNOSIS — K219 Gastro-esophageal reflux disease without esophagitis: Secondary | ICD-10-CM | POA: Diagnosis not present

## 2016-12-12 DIAGNOSIS — G894 Chronic pain syndrome: Secondary | ICD-10-CM | POA: Insufficient documentation

## 2016-12-12 DIAGNOSIS — E7800 Pure hypercholesterolemia, unspecified: Secondary | ICD-10-CM | POA: Insufficient documentation

## 2016-12-12 DIAGNOSIS — M810 Age-related osteoporosis without current pathological fracture: Secondary | ICD-10-CM | POA: Insufficient documentation

## 2016-12-12 DIAGNOSIS — M4316 Spondylolisthesis, lumbar region: Secondary | ICD-10-CM | POA: Insufficient documentation

## 2016-12-12 DIAGNOSIS — Z5181 Encounter for therapeutic drug level monitoring: Secondary | ICD-10-CM | POA: Diagnosis not present

## 2016-12-12 DIAGNOSIS — F329 Major depressive disorder, single episode, unspecified: Secondary | ICD-10-CM | POA: Insufficient documentation

## 2016-12-12 DIAGNOSIS — R7303 Prediabetes: Secondary | ICD-10-CM | POA: Insufficient documentation

## 2016-12-12 DIAGNOSIS — Z79899 Other long term (current) drug therapy: Secondary | ICD-10-CM | POA: Insufficient documentation

## 2016-12-12 MED ORDER — DIAZEPAM 5 MG PO TABS: 5.0000 mg | ORAL_TABLET | Freq: Four times a day (QID) | ORAL | 0 refills | Status: DC | PRN

## 2016-12-12 NOTE — Patient Instructions (Signed)

## 2016-12-12 NOTE — Progress Notes (Signed)

## 2016-12-12 NOTE — Progress Notes (Signed)
  PROCEDURE RECORD [facility]   Name: Suzanne Vasquez DOB:09/12/1950 MRN: [id number]  Date:12/12/2016  Physician: Alysia Penna, MD    Nurse/CMA: Colan Laymon, CMA  Allergies: No Active Allergies  Consent Signed: Yes.    Is patient diabetic? No.  CBG today?   Pregnant: No. LMP: No LMP recorded. Patient has had a hysterectomy. (age [age])  Anticoagulants: no Anti-inflammatory: no Antibiotics: no  Procedure: bilateral medial branch block Position: Prone Start Time: 11:34am  End Time: 11:48am  Fluoro Time: 1:10  RN/CMA Floris Neuhaus, CMA Deaven Barron, CMA    Time 11:00am 11:55pm    BP 146/89 153/106    Pulse 90 79    Respirations 14 14    O2 Sat 98 91    S/S 6 6    Pain Level 7/10 1/10     D/C home with friend Kennyth Lose), patient A & O X 3, D/C instructions reviewed, and sits independently.

## 2017-01-03 ENCOUNTER — Ambulatory Visit: Payer: Medicare HMO | Admitting: Physical Medicine & Rehabilitation

## 2017-01-03 ENCOUNTER — Ambulatory Visit: Payer: Medicare HMO

## 2017-01-27 DIAGNOSIS — J040 Acute laryngitis: Secondary | ICD-10-CM | POA: Diagnosis not present

## 2017-02-06 ENCOUNTER — Encounter: Payer: Self-pay | Admitting: Physical Medicine & Rehabilitation

## 2017-02-06 ENCOUNTER — Ambulatory Visit: Payer: Medicare HMO | Admitting: Physical Medicine & Rehabilitation

## 2017-02-06 ENCOUNTER — Encounter: Payer: Medicare HMO | Attending: Physical Medicine & Rehabilitation

## 2017-02-06 VITALS — BP 140/93 | HR 95 | Resp 14

## 2017-02-06 DIAGNOSIS — M810 Age-related osteoporosis without current pathological fracture: Secondary | ICD-10-CM | POA: Diagnosis not present

## 2017-02-06 DIAGNOSIS — M47816 Spondylosis without myelopathy or radiculopathy, lumbar region: Secondary | ICD-10-CM | POA: Diagnosis not present

## 2017-02-06 DIAGNOSIS — K219 Gastro-esophageal reflux disease without esophagitis: Secondary | ICD-10-CM | POA: Insufficient documentation

## 2017-02-06 DIAGNOSIS — M4316 Spondylolisthesis, lumbar region: Secondary | ICD-10-CM | POA: Insufficient documentation

## 2017-02-06 DIAGNOSIS — E7800 Pure hypercholesterolemia, unspecified: Secondary | ICD-10-CM | POA: Diagnosis not present

## 2017-02-06 DIAGNOSIS — Z5181 Encounter for therapeutic drug level monitoring: Secondary | ICD-10-CM | POA: Diagnosis not present

## 2017-02-06 DIAGNOSIS — R7303 Prediabetes: Secondary | ICD-10-CM | POA: Diagnosis not present

## 2017-02-06 DIAGNOSIS — F329 Major depressive disorder, single episode, unspecified: Secondary | ICD-10-CM | POA: Insufficient documentation

## 2017-02-06 DIAGNOSIS — Z79899 Other long term (current) drug therapy: Secondary | ICD-10-CM | POA: Insufficient documentation

## 2017-02-06 DIAGNOSIS — G894 Chronic pain syndrome: Secondary | ICD-10-CM | POA: Insufficient documentation

## 2017-02-06 NOTE — Progress Notes (Signed)

## 2017-02-06 NOTE — Patient Instructions (Signed)
You had a radio frequency procedure today This was done to alleviate joint pain in your lumbar area We injected lidocaine which is a local anesthetic.  You may experience soreness at the injection sites. You may also experienced some irritation of the nerves that were heated I'm recommending ice for 30 minutes every 2 hours as needed for the next 24-48 hours   

## 2017-02-06 NOTE — Progress Notes (Signed)
  PROCEDURE RECORD [facility]   Name: CAITLAIN TWEED DOB:03/19/1950 MRN: [id number]  Date:02/06/2017  Physician: Alysia Penna, MD    Nurse/CMA: Leane L[place], CMA  Allergies: No Active Allergies  Consent Signed: No.  Is patient diabetic? No.  CBG today?   Pregnant: No. LMP: No LMP recorded. Patient has had a hysterectomy. (age [age])  Anticoagulants: no Anti-inflammatory: no Antibiotics: no  Procedure: right L3,4,5 Radiofrequency Neurotomy (10 cm) Position: Prone Start Time: 12:20pm  End Time: 12:35pm  Fluoro Time: 42s  RN/CMA Dorethea Strubel, CMA James Senn, CMA    Time 11:55am 12:42pm    BP 140/93 156/95    Pulse 95 88    Respirations 16 16    O2 Sat 98 95    S/S 6 6    Pain Level 8/10 5/10     D/C home with Kennyth Lose, patient A & O X 3, D/C instructions reviewed, and sits independently.

## 2017-03-06 ENCOUNTER — Encounter: Payer: Self-pay | Admitting: Physical Medicine & Rehabilitation

## 2017-03-06 ENCOUNTER — Ambulatory Visit: Payer: Medicare HMO | Admitting: Physical Medicine & Rehabilitation

## 2017-03-06 VITALS — BP 146/87 | HR 95

## 2017-03-06 DIAGNOSIS — E7800 Pure hypercholesterolemia, unspecified: Secondary | ICD-10-CM | POA: Diagnosis not present

## 2017-03-06 DIAGNOSIS — M47816 Spondylosis without myelopathy or radiculopathy, lumbar region: Secondary | ICD-10-CM | POA: Diagnosis not present

## 2017-03-06 DIAGNOSIS — Z79899 Other long term (current) drug therapy: Secondary | ICD-10-CM | POA: Diagnosis not present

## 2017-03-06 DIAGNOSIS — K219 Gastro-esophageal reflux disease without esophagitis: Secondary | ICD-10-CM | POA: Diagnosis not present

## 2017-03-06 DIAGNOSIS — G894 Chronic pain syndrome: Secondary | ICD-10-CM | POA: Diagnosis not present

## 2017-03-06 DIAGNOSIS — M4316 Spondylolisthesis, lumbar region: Secondary | ICD-10-CM | POA: Diagnosis not present

## 2017-03-06 DIAGNOSIS — Z5181 Encounter for therapeutic drug level monitoring: Secondary | ICD-10-CM | POA: Diagnosis not present

## 2017-03-06 DIAGNOSIS — F329 Major depressive disorder, single episode, unspecified: Secondary | ICD-10-CM | POA: Diagnosis not present

## 2017-03-06 DIAGNOSIS — R7303 Prediabetes: Secondary | ICD-10-CM | POA: Diagnosis not present

## 2017-03-06 NOTE — Progress Notes (Signed)
  PROCEDURE RECORD [facility]   Name: Suzanne Vasquez DOB:[birth year] MRN: [id number]  Date:03/06/2017  Physician: Alysia Penna, MD    Nurse/CMA:Bright CMA  Allergies: No Active Allergies  Consent Signed: Yes.    Is patient diabetic? No.  CBG today? NA  Pregnant: No. LMP: No LMP recorded. Patient has had a hysterectomy. (age [age])  Anticoagulants: no Anti-inflammatory: no Antibiotics: no  Procedure: Left L3-5 RFA Position: Prone   Start Time: 1110am  End Time: 1130am  Fluoro Time: 64s  RN/CMA Bright CMA Bright CMA    Time 1053am 1137am    BP 146/87 152/97    Pulse 95 100    Respirations 16 16    O2 Sat 98 97    S/S 6 6    Pain Level 1/10 0/10     D/C home with Suzanne Vasquez, patient A & O X 3, D/C instructions reviewed, and sits independently.

## 2017-03-06 NOTE — Patient Instructions (Signed)

## 2017-03-06 NOTE — Progress Notes (Signed)
Left L5 dorsal ramus., left L4 and left L3 medial branch radio frequency neurotomy under fluoroscopic guidance  Indication: Low back pain due to lumbar spondylosis which has been relieved on 2 occasions by greater than 50% by lumbar medial branch blocks at corresponding levels.  Informed consent was obtained after describing risks and benefits of the procedure with the patient, this includes bleeding, bruising, infection, paralysis and medication side effects. The patient wishes to proceed and has given written consent. The patient was placed in a prone position. The lumbar and sacral area was marked and prepped with Betadine. A 25-gauge 1-1/2 inch needle was inserted into the skin and subcutaneous tissue at 3 sites in one ML of 1% lidocaine was injected into each site. Then a 18-gauge 10 cm radio frequency needle with a 1 cm curved active tip was inserted targeting the left S1 SAP/sacral ala junction. Bone contact was made and confirmed with lateral imaging.  motor stimulation at 2 Hz confirm proper needle location followed by injection of one ML of 1% MPF lidocaine. Then the left L5 SAP/transverse process junction was targeted. Bone contact was made and confirmed with lateral imaging motor stimulation at 2 Hz confirm proper needle location followed by injection of one ML of the solution containing one ML of  1% MPF lidocaine. Then the left L4 SAP/transverse process junction was targeted. Bone contact was made and confirmed with lateral imaging. motor stimulation at 2 Hz confirm proper needle location followed by injection of one ML of the solution containing one ML of1% MPF lidocaine. Radio frequency lesion  at 80C for 90 seconds was performed. Needles were removed. Post procedure instructions and vital signs were performed. Patient tolerated procedure well. Followup appointment was given.  

## 2017-03-14 DIAGNOSIS — E785 Hyperlipidemia, unspecified: Secondary | ICD-10-CM | POA: Diagnosis not present

## 2017-03-14 DIAGNOSIS — E559 Vitamin D deficiency, unspecified: Secondary | ICD-10-CM | POA: Diagnosis not present

## 2017-03-14 DIAGNOSIS — R7303 Prediabetes: Secondary | ICD-10-CM | POA: Diagnosis not present

## 2017-03-14 DIAGNOSIS — J309 Allergic rhinitis, unspecified: Secondary | ICD-10-CM | POA: Diagnosis not present

## 2017-04-03 DIAGNOSIS — R8290 Unspecified abnormal findings in urine: Secondary | ICD-10-CM | POA: Diagnosis not present

## 2017-05-12 DIAGNOSIS — H5340 Unspecified visual field defects: Secondary | ICD-10-CM | POA: Diagnosis not present

## 2017-05-12 DIAGNOSIS — H40003 Preglaucoma, unspecified, bilateral: Secondary | ICD-10-CM | POA: Diagnosis not present

## 2017-06-23 ENCOUNTER — Other Ambulatory Visit: Payer: Self-pay | Admitting: Physician Assistant

## 2017-06-23 DIAGNOSIS — M8580 Other specified disorders of bone density and structure, unspecified site: Secondary | ICD-10-CM

## 2017-06-23 DIAGNOSIS — Z139 Encounter for screening, unspecified: Secondary | ICD-10-CM

## 2017-08-04 DIAGNOSIS — Z0100 Encounter for examination of eyes and vision without abnormal findings: Secondary | ICD-10-CM | POA: Diagnosis not present

## 2017-08-11 ENCOUNTER — Ambulatory Visit
Admission: RE | Admit: 2017-08-11 | Discharge: 2017-08-11 | Disposition: A | Discharge status: Home / Self Care | Payer: Medicare HMO | Source: Ambulatory Visit | Attending: Physician Assistant | Admitting: Physician Assistant

## 2017-08-11 DIAGNOSIS — Z1231 Encounter for screening mammogram for malignant neoplasm of breast: Secondary | ICD-10-CM | POA: Diagnosis not present

## 2017-08-11 DIAGNOSIS — Z139 Encounter for screening, unspecified: Secondary | ICD-10-CM

## 2017-08-11 DIAGNOSIS — M8580 Other specified disorders of bone density and structure, unspecified site: Secondary | ICD-10-CM

## 2017-08-12 DIAGNOSIS — K644 Residual hemorrhoidal skin tags: Secondary | ICD-10-CM | POA: Diagnosis not present

## 2017-08-13 ENCOUNTER — Ambulatory Visit
Admission: RE | Admit: 2017-08-13 | Discharge: 2017-08-13 | Disposition: A | Discharge status: Home / Self Care | Payer: Medicare HMO | Source: Ambulatory Visit | Attending: Physician Assistant | Admitting: Physician Assistant

## 2017-08-13 DIAGNOSIS — M85852 Other specified disorders of bone density and structure, left thigh: Secondary | ICD-10-CM | POA: Diagnosis not present

## 2017-08-13 DIAGNOSIS — Z780 Asymptomatic menopausal state: Secondary | ICD-10-CM | POA: Diagnosis not present

## 2017-09-11 DIAGNOSIS — Z91018 Allergy to other foods: Secondary | ICD-10-CM | POA: Diagnosis not present

## 2017-09-11 DIAGNOSIS — J309 Allergic rhinitis, unspecified: Secondary | ICD-10-CM | POA: Diagnosis not present

## 2017-09-11 DIAGNOSIS — Z0000 Encounter for general adult medical examination without abnormal findings: Secondary | ICD-10-CM | POA: Diagnosis not present

## 2017-09-11 DIAGNOSIS — Z1389 Encounter for screening for other disorder: Secondary | ICD-10-CM | POA: Diagnosis not present

## 2017-09-11 DIAGNOSIS — M8580 Other specified disorders of bone density and structure, unspecified site: Secondary | ICD-10-CM | POA: Diagnosis not present

## 2017-09-11 DIAGNOSIS — E785 Hyperlipidemia, unspecified: Secondary | ICD-10-CM | POA: Diagnosis not present

## 2017-09-11 DIAGNOSIS — R7303 Prediabetes: Secondary | ICD-10-CM | POA: Diagnosis not present

## 2017-09-11 DIAGNOSIS — E559 Vitamin D deficiency, unspecified: Secondary | ICD-10-CM | POA: Diagnosis not present

## 2017-09-15 DIAGNOSIS — Z111 Encounter for screening for respiratory tuberculosis: Secondary | ICD-10-CM | POA: Diagnosis not present

## 2017-10-17 ENCOUNTER — Encounter: Payer: Self-pay | Admitting: Allergy

## 2017-10-17 ENCOUNTER — Ambulatory Visit: Payer: Medicare HMO | Admitting: Allergy

## 2017-10-17 VITALS — BP 110/72 | HR 94 | Resp 16 | Ht 65.0 in | Wt 170.4 lb

## 2017-10-17 DIAGNOSIS — J301 Allergic rhinitis due to pollen: Secondary | ICD-10-CM | POA: Diagnosis not present

## 2017-10-17 DIAGNOSIS — T781XXA Other adverse food reactions, not elsewhere classified, initial encounter: Secondary | ICD-10-CM

## 2017-10-17 MED ORDER — EPINEPHRINE 0.3 MG/0.3ML IJ SOAJ: 0.3000 mg | Freq: Once | INTRAMUSCULAR | 1 refills | Status: AC

## 2017-10-17 NOTE — Progress Notes (Signed)
New Patient Note  RE: Suzanne Vasquez MRN: [id number] DOB: 12-26-1950 Date of Office Visit: 10/17/2017  Referring provider: Lois Huxley, PA Primary care provider: Maude Leriche, PA-C  Chief Complaint: concern for food allergy  History of present illness: Suzanne Vasquez is a 67 y.o. female presenting today for consultation for possible food reaction.  She states she has been breaking out after she eats and is concerned she has a food allergy.  The rash started appearing about 3 weeks ago.  The rash develops on her face and she describes the rash as "bumps".  She does feel that the bumps are itchy.  She states the bumps will leave a dark blotch once it resolves.  She has not noticed the bumps occurring anyplace else besides her face.  She states the rash does not occur daily and she can not pinpoint any particular foods that seem to trigger this rash.  No swelling.  No difficulty breathing or swallowing, no N/V and no CV related symptoms.   She states she started using African black soap face wash and lotion to clear her skin that she got from [employer].  She does feel that this soap has been effective for her.  She has also used toothpaste on the bumps and she states that this is also helped.   She will be seeing a dermatologist in October for this rash.    She does take cetirizine daily but did stop Monday for this appointment.  She has noticed increased nasal drainage since stopping cetirizine.  She also reports sneezing.  She states pollen seasons are worse for her.  She has used flonase in the past.      No history of eczema or asthma.    Review of systems: Review of Systems  Constitutional: Negative for chills, fever and malaise/fatigue.  HENT: Positive for congestion. Negative for ear discharge, ear pain, nosebleeds, sinus pain and sore throat.   Eyes: Negative for pain, discharge and redness.  Respiratory: Negative for cough, shortness of breath and wheezing.   Cardiovascular:  Negative for chest pain.  Gastrointestinal: Negative for abdominal pain, constipation, diarrhea, heartburn, nausea and vomiting.  Musculoskeletal: Negative for joint pain.  Skin: Positive for itching and rash.  Neurological: Negative for headaches.    All other systems negative unless noted above in HPI  Past medical history: Past Medical History:  Diagnosis Date  . Allergy    seasonal  . Anemia   . Blood transfusion without reported diagnosis    about "40" years ago  . Degenerative disc disease, lumbar   . Depression   . GERD (gastroesophageal reflux disease)   . Hypercholesteremia   . Osteoporosis   . Seasonal allergies     Past surgical history: Past Surgical History:  Procedure Laterality Date  . ABDOMINAL HYSTERECTOMY  1983  . CARPAL TUNNEL RELEASE  2004    Family history:  Family History  Problem Relation Age of Onset  . Diabetes Mother   . Heart failure Mother   . Diabetes Brother   . Eczema Brother   . Hypertension Brother   . Eczema Son   . Colon cancer Neg Hx   . Breast cancer Neg Hx     Social history: She lives in a town home without carpeting with electric heating.  There is no concern for water damage, mildew or roaches in the home.  There is no pets in the home.  She is a caregiver.  She denies a  smoking history.  Medication List: Allergies as of 10/17/2017   No Known Allergies     Medication List        Accurate as of 10/17/17  4:59 PM. Always use your most recent med list.          Biotin 5000 MCG Caps Take 1 capsule by mouth daily.   cetirizine 10 MG tablet Commonly known as:  ZYRTEC Take 10 mg by mouth daily.   cholecalciferol 1000 units tablet Commonly known as:  VITAMIN D Take 1,000 Units by mouth daily.   naproxen sodium 220 MG tablet Commonly known as:  ALEVE Take 220 mg by mouth daily as needed.   simvastatin 20 MG tablet Commonly known as:  ZOCOR Take 20 mg by mouth every morning.       Known medication  allergies: No Known Allergies   Physical examination: Blood pressure 110/72, pulse 94, resp. rate 16, height 5\' 5"  (1.651 m), weight 170 lb 6.4 oz (77.3 kg), SpO2 96 %.  General: Alert, interactive, in no acute distress. HEENT: PERRLA, TMs pearly gray, turbinates mildly edematous without discharge, post-pharynx non erythematous. Neck: Supple without lymphadenopathy. Lungs: Clear to auscultation without wheezing, rhonchi or rales. {no increased work of breathing. CV: Normal S1, S2 without murmurs. Abdomen: Nondistended, nontender. Skin: Warm and dry, without lesions or rashes.  No visible rash today Extremities:  No clubbing, cyanosis or edema. Neuro:   Grossly intact.  Diagnositics/Labs:  Allergy testing: Environmental allergy skin prick testing today is positive tograss, weeds, trees, molds, dust mite, dog, mold Select food allergy skin prick testing is mildly positive to pecan, walnuts, hazelnut, sweet potato, nutmeg Allergy testing results were read and interpreted by provider, documented by clinical staff.   Assessment and plan:   Adverse food reaction   - skin testing to common foods in the diet with mild positivity to pecan, walnuts, hazelnut, sweet potato, nutmeg  -Discussed with her the difference between sensitivity and clinical reactivity.  She does appear to be sensitized to several foods however I am not sure that she has clinical reactivity to these foods.  She has not been able to pinpoint any triggering foods that cause development of this rash.  - would recommend avoidance of these foods in the diet and determine if rash is prevented with food avoidance or not  -We will equip her with and emergency action plan as well as prescription for epinephrine device in case of allergic reaction.    Allergic rhinitis  - environmental allergy skin testing is positive to grass, weeds, trees, molds, dust mite, dog, mold  - allergen avoidance measures discussed/handouts provided   - continue use of cetirizine 10mg  daily  - for nasal congestion/drainage recommend use of nasal steroid spray like Flonase, Rhinocort or Nasacort 2 sprays each nostril daily. Use for 1-2 weeks at a time before stopping once symptoms improve  Follow-up 4-6 months or sooner if needed   I appreciate the opportunity to take part in [facility] care. Please do not hesitate to contact me with questions.  Sincerely,   Prudy Feeler, MD Allergy/Immunology [facility] [place]

## 2017-10-17 NOTE — Patient Instructions (Signed)
Adverse food reaction   - skin testing to common foods in the diet with mild positivity to pecan, walnuts, hazelnut, sweet potato, nutmeg  - would recommend avoidance of these foods in the diet and determine if the bumps return or not  - emergency action plan provided as well as prescription for epinephrine device in case of allergic reaction.    Allergic rhinitis  - environmental allergy skin testing is positive to grass, weeds, trees, molds, dust mite, dog, mold  - allergen avoidance measures discussed/handouts provided  - continue use of cetirizine 10mg  daily  - for nasal congestion/drainage recommend use of nasal steroid spray like Flonase, Rhinocort or Nasacort 2 sprays each nostril daily. Use for 1-2 weeks at a time before stopping once symptoms improve  Follow-up 4-6 months or sooner if needed

## 2017-11-04 ENCOUNTER — Telehealth: Payer: Self-pay | Admitting: Allergy

## 2017-11-04 NOTE — Telephone Encounter (Signed)
I reviewed her last note, and she did have testing that was positive to pecan, walnuts, and hazelnut.  It seems that [place] was negative, but this was likely cross-contamination since tree nuts are all processed together.  I recommend avoiding all tree nuts to avoid reactions.  Salvatore Marvel, MD [facility]

## 2017-11-04 NOTE — Telephone Encounter (Signed)
Called and informed patient of Dr. Gillermina Hu recommendation. Patient verbalized understanding and will avoid all nuts.

## 2017-11-04 NOTE — Telephone Encounter (Signed)
Called and left a voicemail for the patient to call back so that we can discuss.

## 2017-11-04 NOTE — Telephone Encounter (Signed)
Pt called and wants to know if she can have almonds? Please call pt and talk with her about nut allergys. [phone]

## 2017-11-04 NOTE — Telephone Encounter (Signed)
Patient called back and talked about eating almonds. She ate a few almonds earlier today and noticed some nasal congestion, she was wondering if she should avoid it or if the nasal congestion was maybe related to something else? Her last AVS will be mailed to her home since she cannot remember the items on her plan that she needs to abide by. Dr. Ernst Bowler will you please advise?

## 2017-11-18 DIAGNOSIS — L818 Other specified disorders of pigmentation: Secondary | ICD-10-CM | POA: Diagnosis not present

## 2018-03-09 DIAGNOSIS — H16141 Punctate keratitis, right eye: Secondary | ICD-10-CM | POA: Diagnosis not present

## 2018-04-09 DIAGNOSIS — H52223 Regular astigmatism, bilateral: Secondary | ICD-10-CM | POA: Diagnosis not present

## 2018-04-09 DIAGNOSIS — H2513 Age-related nuclear cataract, bilateral: Secondary | ICD-10-CM | POA: Diagnosis not present

## 2018-04-09 DIAGNOSIS — H40013 Open angle with borderline findings, low risk, bilateral: Secondary | ICD-10-CM | POA: Diagnosis not present

## 2018-04-09 DIAGNOSIS — H5203 Hypermetropia, bilateral: Secondary | ICD-10-CM | POA: Diagnosis not present

## 2018-04-09 DIAGNOSIS — H524 Presbyopia: Secondary | ICD-10-CM | POA: Diagnosis not present

## 2018-04-10 ENCOUNTER — Ambulatory Visit: Payer: Medicare HMO | Admitting: Allergy

## 2018-04-30 ENCOUNTER — Ambulatory Visit: Payer: Medicare HMO | Admitting: Allergy

## 2018-08-25 ENCOUNTER — Other Ambulatory Visit: Payer: Self-pay | Admitting: Physician Assistant

## 2018-08-25 DIAGNOSIS — Z1231 Encounter for screening mammogram for malignant neoplasm of breast: Secondary | ICD-10-CM

## 2018-10-14 ENCOUNTER — Ambulatory Visit
Admission: RE | Admit: 2018-10-14 | Discharge: 2018-10-14 | Disposition: A | Discharge status: Home / Self Care | Payer: Medicare Other | Source: Ambulatory Visit | Attending: Physician Assistant | Admitting: Physician Assistant

## 2018-10-14 ENCOUNTER — Other Ambulatory Visit: Payer: Self-pay

## 2018-10-14 DIAGNOSIS — Z1231 Encounter for screening mammogram for malignant neoplasm of breast: Secondary | ICD-10-CM

## 2019-01-20 IMAGING — MR MR LUMBAR SPINE W/O CM
4 of 5 series · 30 of 48 positions shown · non-contrast
Comparison: Lumbar MRI 12/14/2007

CLINICAL DATA: 66-year-old female with chronic lumbar back pain.

EXAM:
MRI LUMBAR SPINE WITHOUT CONTRAST
TECHNIQUE: Multiplanar, multisequence MR imaging of the lumbar spine was
performed. No intravenous contrast was administered.

[Series 2: T2 post-contrast · sagittal · 4.0mm · 0.88mm/px · 5 of 12 slices shown]
[im 1/12]
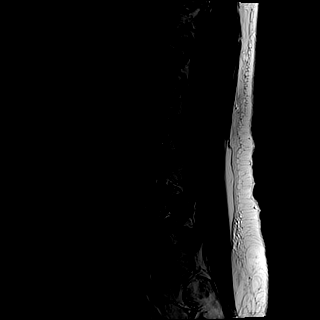
[im 3/12]
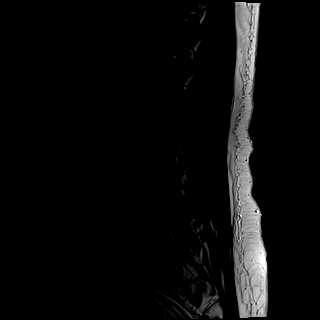
[im 6/12]
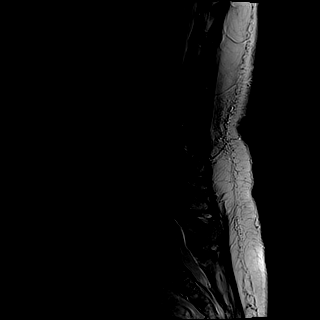
[im 9/12]
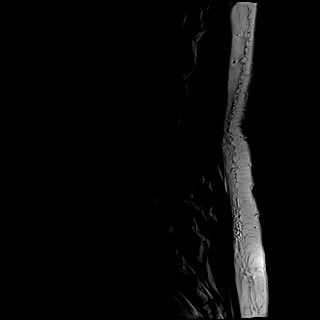
[im 12/12]
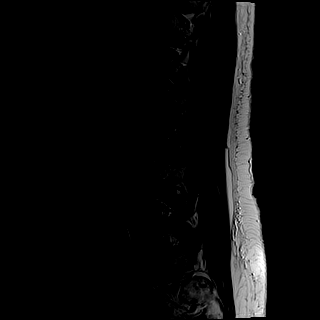

[Series 3: T1 · sagittal · 4.0mm · 0.88mm/px · 5 of 12 slices shown (1 of 2)]
[im 1/12]
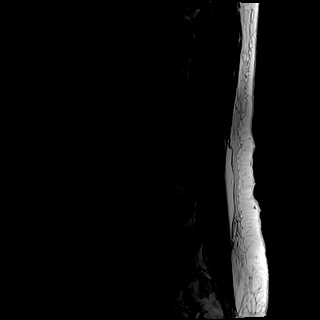
[im 3/12]
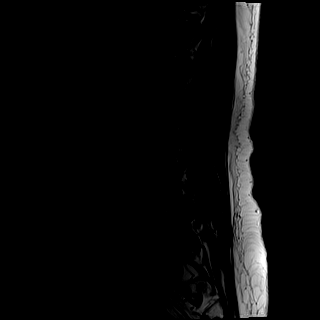
[im 6/12]
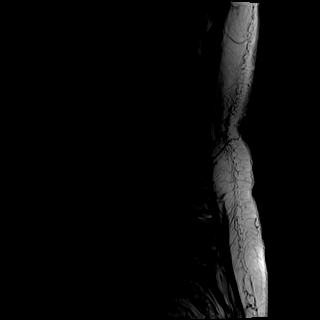
[im 9/12]
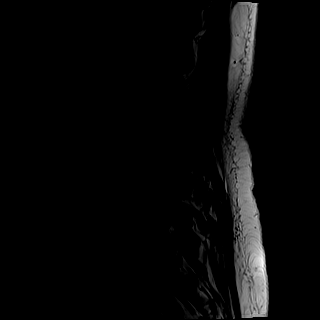
[im 12/12]
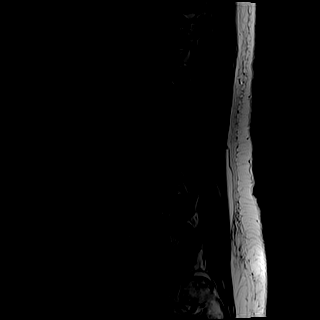

[Series 5: T1 · axial · 4.0mm · 0.37mm/px · z∈[-81,+165]mm · 10 of 33 slices shown (2 of 2)]
[im 3/33]
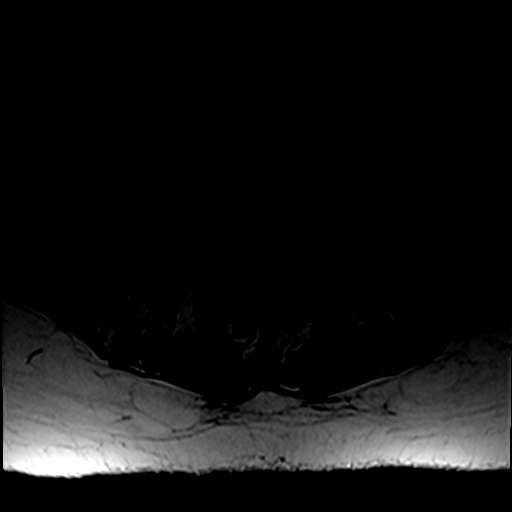
[im 5/33]
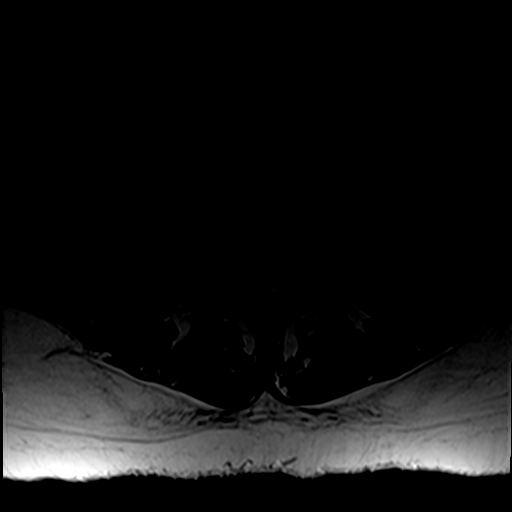
[im 7/33]
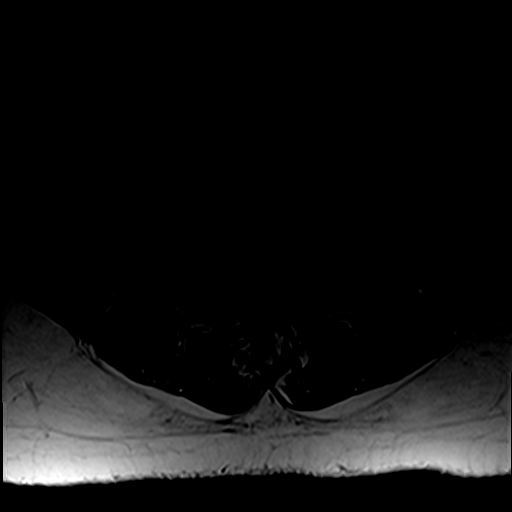
[im 11/33]
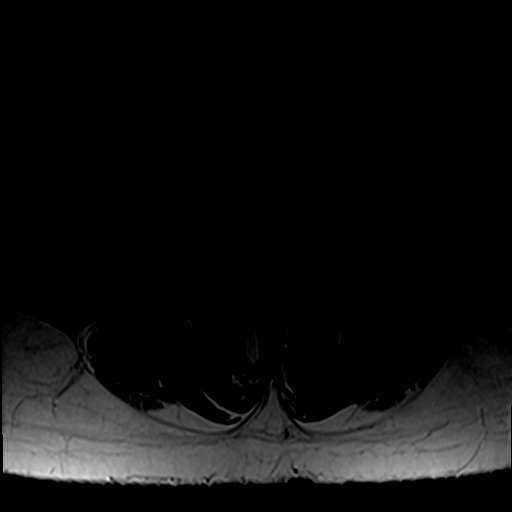
[im 15/33]
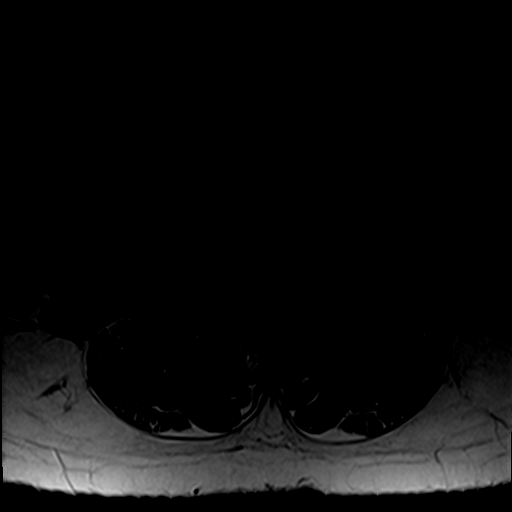
[im 18/33]
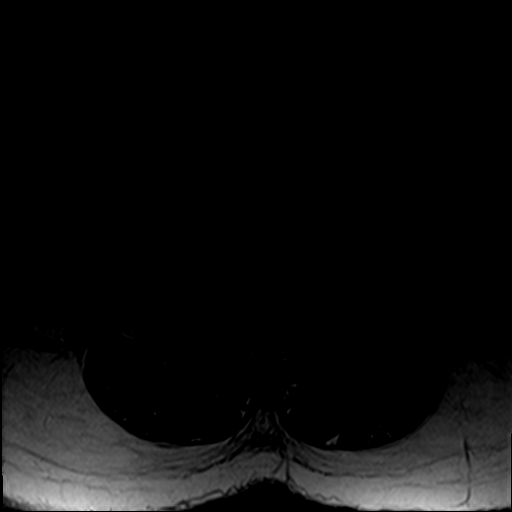
[im 20/33]
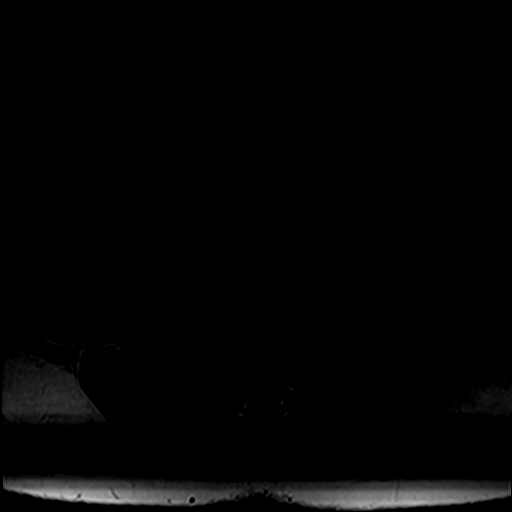
[im 24/33]
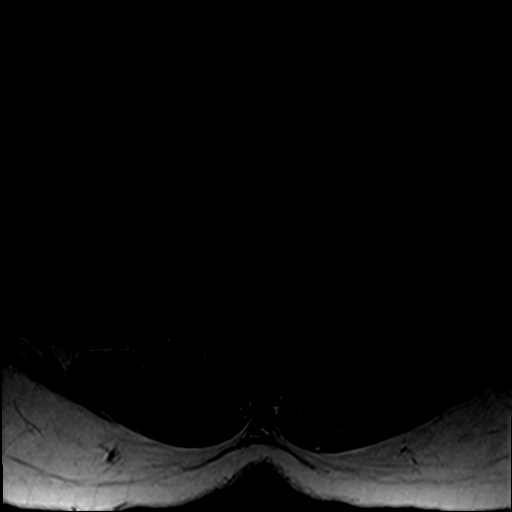
[im 28/33]
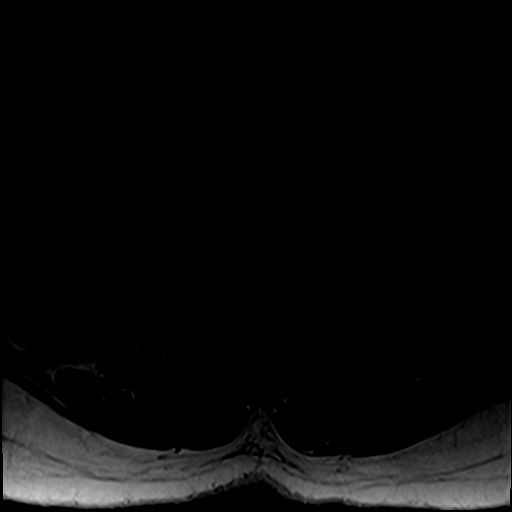
[im 33/33]
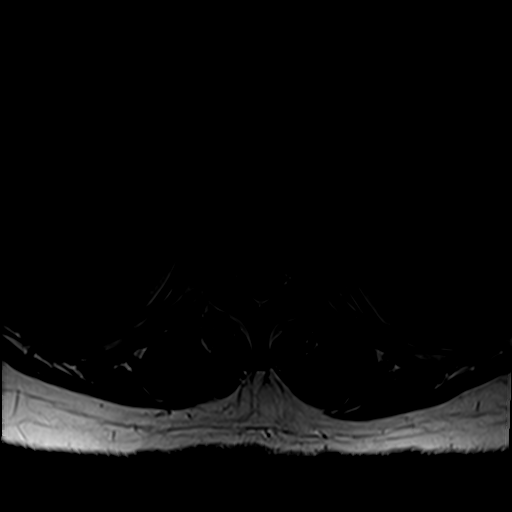

[Series 6: T2 · axial · 4.0mm · 0.74mm/px · z∈[-81,+165]mm · 10 of 33 slices shown]
[im 3/33]
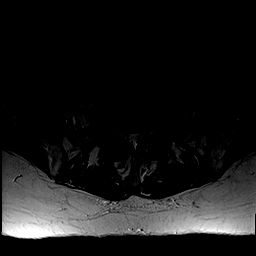
[im 5/33]
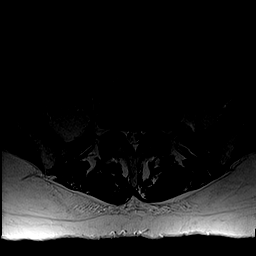
[im 7/33]
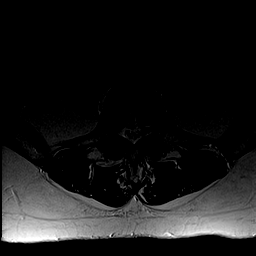
[im 11/33]
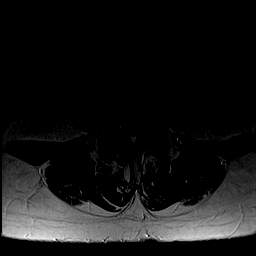
[im 15/33]
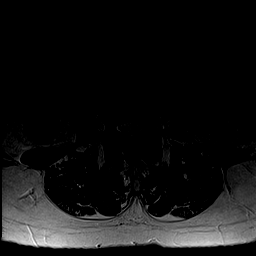
[im 18/33]
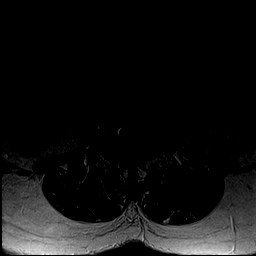
[im 20/33]
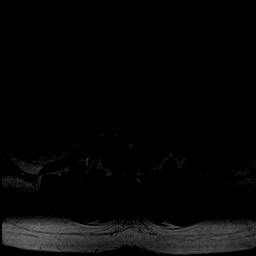
[im 24/33]
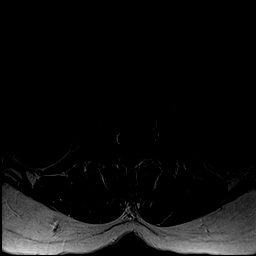
[im 28/33]
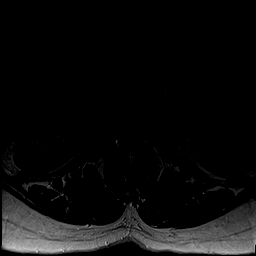
[im 33/33]
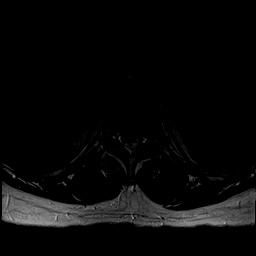

[30 of 48 positions shown; findings below may reference images not displayed]

FINDINGS: Segmentation: Lumbar segmentation appears to be normal which is the
same numbering system used on the 7884 MRI.

Alignment: Grade 1 anterolisthesis at L4-L5 was subtle in 7884 and
has progressed, now measuring up to 7 or 8 mm. Progression also of
retrolisthesis of L5 on S1. Normal vertebral height and alignment
elsewhere.

Vertebrae: Degenerative endplate marrow changes have developed at
L5-S1. No marrow edema or evidence of acute osseous abnormality.
Negative visible sacrum.

Conus medullaris: Extends to the L1-L2 level and appears normal.

Paraspinal and other soft tissues: Negative.

Disc levels:

T11-T12:  Mild facet hypertrophy.

T12-L1:  Negative.

L1-L2:  Mild facet hypertrophy.

L2-L3: Mild facet hypertrophy. Mild right foraminal disc bulge and
endplate spurring. Borderline to mild spinal and right L2 foraminal
stenosis.

L3-L4: Mild circumferential but mostly far lateral disc bulging.
Mild to moderate facet and ligament flavum hypertrophy. Borderline
to mild spinal stenosis.

L4-L5: Grade 1 anterolisthesis and decreased anterior disc space
since 7884. Moderate to severe facet and ligament flavum
hypertrophy, although facet joint fluid has resolved progressed and
now severe spinal and bilateral lateral recess (descending L5 nerve
root levels) stenosis (series 6, image 24). No convincing foraminal
stenosis.

L5-S1: Retrolisthesis with severe disc space loss since 7884.
Probable vacuum disc. Increased circumferential disc osteophyte
complex. There is a small caudal disc extrusion in the midline where
an annular fissure was noted in 7884 (series 2, image 7 and series
6, image 31). Mild to moderate facet and ligament flavum hypertrophy
has not significantly changed. Mild spinal stenosis has not
significantly changed although mild to moderate bilateral lateral
recess stenosis has progressed (descending S1 nerve root levels).
New mild bilateral L5 foraminal stenosis.
IMPRESSION: 1. Progressed grade 1 anterolisthesis at L4-L5 and retrolisthesis at
L5-S1 since 7884 with progressed disc and posterior element
degeneration at both levels. Severe disc degeneration now at L5-S1.
2. Associated increased severe spinal and bilateral lateral recess
stenosis at L4-L5. Mild spinal stenosis at L5-S1 is not
significantly changed, but mild to moderate bilateral lateral recess
and mild bilateral foraminal stenosis at that level is new.
3. Increased lumbar disc bulging and facet hypertrophy elsewhere
with borderline to mild spinal stenosis now at L2-L3 and L3-L4.

## 2019-09-01 ENCOUNTER — Other Ambulatory Visit: Payer: Self-pay | Admitting: Physician Assistant

## 2019-09-01 DIAGNOSIS — Z1231 Encounter for screening mammogram for malignant neoplasm of breast: Secondary | ICD-10-CM

## 2019-10-18 ENCOUNTER — Ambulatory Visit
Admission: RE | Admit: 2019-10-18 | Discharge: 2019-10-18 | Disposition: A | Discharge status: Home / Self Care | Payer: Medicare Other | Source: Ambulatory Visit | Attending: Physician Assistant | Admitting: Physician Assistant

## 2019-10-18 ENCOUNTER — Other Ambulatory Visit: Payer: Self-pay

## 2019-10-18 DIAGNOSIS — Z1231 Encounter for screening mammogram for malignant neoplasm of breast: Secondary | ICD-10-CM

## 2020-03-02 DIAGNOSIS — E1169 Type 2 diabetes mellitus with other specified complication: Secondary | ICD-10-CM | POA: Diagnosis not present

## 2020-05-15 DIAGNOSIS — K648 Other hemorrhoids: Secondary | ICD-10-CM | POA: Diagnosis not present

## 2020-08-16 DIAGNOSIS — Z20822 Contact with and (suspected) exposure to covid-19: Secondary | ICD-10-CM | POA: Diagnosis not present

## 2020-08-30 DIAGNOSIS — E1169 Type 2 diabetes mellitus with other specified complication: Secondary | ICD-10-CM | POA: Diagnosis not present

## 2020-08-30 DIAGNOSIS — E785 Hyperlipidemia, unspecified: Secondary | ICD-10-CM | POA: Diagnosis not present

## 2020-09-05 ENCOUNTER — Other Ambulatory Visit: Payer: Self-pay | Admitting: Physician Assistant

## 2020-09-05 DIAGNOSIS — Z1231 Encounter for screening mammogram for malignant neoplasm of breast: Secondary | ICD-10-CM

## 2020-09-27 DIAGNOSIS — H40013 Open angle with borderline findings, low risk, bilateral: Secondary | ICD-10-CM | POA: Diagnosis not present

## 2020-09-27 DIAGNOSIS — H5213 Myopia, bilateral: Secondary | ICD-10-CM | POA: Diagnosis not present

## 2020-10-26 ENCOUNTER — Other Ambulatory Visit: Payer: Self-pay

## 2020-10-26 ENCOUNTER — Ambulatory Visit
Admission: RE | Admit: 2020-10-26 | Discharge: 2020-10-26 | Disposition: A | Discharge status: Home / Self Care | Payer: Medicare Other | Source: Ambulatory Visit | Attending: Physician Assistant | Admitting: Physician Assistant

## 2020-10-26 DIAGNOSIS — Z1231 Encounter for screening mammogram for malignant neoplasm of breast: Secondary | ICD-10-CM

## 2020-12-19 DIAGNOSIS — N3000 Acute cystitis without hematuria: Secondary | ICD-10-CM | POA: Diagnosis not present

## 2020-12-19 DIAGNOSIS — R300 Dysuria: Secondary | ICD-10-CM | POA: Diagnosis not present

## 2020-12-25 DIAGNOSIS — N3000 Acute cystitis without hematuria: Secondary | ICD-10-CM | POA: Diagnosis not present

## 2020-12-25 DIAGNOSIS — E1169 Type 2 diabetes mellitus with other specified complication: Secondary | ICD-10-CM | POA: Diagnosis not present

## 2020-12-25 DIAGNOSIS — Z0000 Encounter for general adult medical examination without abnormal findings: Secondary | ICD-10-CM | POA: Diagnosis not present

## 2020-12-25 DIAGNOSIS — G8929 Other chronic pain: Secondary | ICD-10-CM | POA: Diagnosis not present

## 2020-12-25 DIAGNOSIS — E785 Hyperlipidemia, unspecified: Secondary | ICD-10-CM | POA: Diagnosis not present

## 2021-01-02 ENCOUNTER — Other Ambulatory Visit: Payer: Self-pay | Admitting: Physician Assistant

## 2021-01-02 DIAGNOSIS — M8580 Other specified disorders of bone density and structure, unspecified site: Secondary | ICD-10-CM

## 2021-01-08 DIAGNOSIS — Z20822 Contact with and (suspected) exposure to covid-19: Secondary | ICD-10-CM | POA: Diagnosis not present

## 2021-01-09 DIAGNOSIS — Z20822 Contact with and (suspected) exposure to covid-19: Secondary | ICD-10-CM | POA: Diagnosis not present

## 2021-01-10 DIAGNOSIS — Z20822 Contact with and (suspected) exposure to covid-19: Secondary | ICD-10-CM | POA: Diagnosis not present

## 2021-02-05 DIAGNOSIS — Z20822 Contact with and (suspected) exposure to covid-19: Secondary | ICD-10-CM | POA: Diagnosis not present

## 2021-02-06 DIAGNOSIS — Z20822 Contact with and (suspected) exposure to covid-19: Secondary | ICD-10-CM | POA: Diagnosis not present

## 2021-02-07 DIAGNOSIS — Z20822 Contact with and (suspected) exposure to covid-19: Secondary | ICD-10-CM | POA: Diagnosis not present

## 2021-03-11 ENCOUNTER — Encounter (HOSPITAL_COMMUNITY): Payer: Self-pay

## 2021-03-11 ENCOUNTER — Emergency Department (HOSPITAL_COMMUNITY)
Admission: EM | Admit: 2021-03-11 | Discharge: 2021-03-11 | Disposition: A | Discharge status: Home / Self Care | Payer: Medicare Other | Attending: Emergency Medicine | Admitting: Emergency Medicine

## 2021-03-11 ENCOUNTER — Other Ambulatory Visit: Payer: Self-pay

## 2021-03-11 DIAGNOSIS — R21 Rash and other nonspecific skin eruption: Secondary | ICD-10-CM | POA: Diagnosis present

## 2021-03-11 DIAGNOSIS — L259 Unspecified contact dermatitis, unspecified cause: Secondary | ICD-10-CM | POA: Diagnosis not present

## 2021-03-11 MED ORDER — HYDROXYZINE HCL 25 MG PO TABS: 25.0000 mg | ORAL_TABLET | Freq: Three times a day (TID) | ORAL | 0 refills | Status: DC | PRN

## 2021-03-11 MED ORDER — HYDROCORTISONE 1 % EX OINT: 1.0000 "application " | TOPICAL_OINTMENT | Freq: Two times a day (BID) | CUTANEOUS | 0 refills | Status: DC

## 2021-03-11 NOTE — ED Notes (Signed)
D/c instructions and prescriptions reviewed with patient. Pt denies questions.

## 2021-03-11 NOTE — ED Provider Notes (Signed)
[facility] DEPT Provider Note   CSN: [id number] Arrival date & time: 03/11/21  2202     History  Chief Complaint  Patient presents with   Rash   Pruritis    Suzanne Vasquez is a 71 y.o. female.  Patient is a 71 year old female who presents with a rash.  She said the rash started about a week ago and its mostly on her neck.  There is some patches on her face.  She is not sure what caused it.  She has been diagnosed with some food allergies in the past.  She has an upcoming appointment with an allergist but not until March.  She denies any facial swelling.  No swelling of her lips or tongue.  No shortness of breath.  She has not been using any medications.  She said the itching is bothering her the most.      Home Medications Prior to Admission medications   Medication Sig Start Date End Date Taking? Authorizing Provider  hydrocortisone 1 % ointment Apply 1 application topically 2 (two) times daily. 03/11/21  Yes Malvin Johns, MD  hydrOXYzine (ATARAX) 25 MG tablet Take 1 tablet (25 mg total) by mouth every 8 (eight) hours as needed for itching. 03/11/21  Yes Malvin Johns, MD  Biotin 5000 MCG CAPS Take 1 capsule by mouth daily.    [provider]  cetirizine (ZYRTEC) 10 MG tablet Take 10 mg by mouth daily.    [provider]  cholecalciferol (VITAMIN D) 1000 units tablet Take 1,000 Units by mouth daily.    [provider]  naproxen sodium (ALEVE) 220 MG tablet Take 220 mg by mouth daily as needed.    [provider]  simvastatin (ZOCOR) 20 MG tablet Take 20 mg by mouth every morning.     [provider]      Allergies    Patient has no known allergies.    Review of Systems   Review of Systems  Constitutional:  Negative for fever.  HENT:  Negative for facial swelling.   Respiratory:  Negative for shortness of breath.   Gastrointestinal:  Negative for nausea and vomiting.  Skin:  Positive for rash.   Neurological:  Negative for light-headedness.  All other systems reviewed and are negative.  Physical Exam Updated Vital Signs BP (!) 147/92 (BP Location: Left Arm)    Pulse 75    Temp 97.9 F (36.6 C) (Oral)    Resp 19    Ht 5\' 5"  (1.651 m)    Wt 77 kg    SpO2 99%    BMI 28.25 kg/m  Physical Exam Constitutional:      Appearance: She is well-developed.  HENT:     Head: Normocephalic and atraumatic.  Eyes:     Pupils: Pupils are equal, round, and reactive to light.  Cardiovascular:     Rate and Rhythm: Normal rate and regular rhythm.     Heart sounds: Normal heart sounds.  Pulmonary:     Effort: Pulmonary effort is normal. No respiratory distress.     Breath sounds: Normal breath sounds. No wheezing or rales.  Chest:     Chest wall: No tenderness.  Abdominal:     General: Bowel sounds are normal.     Palpations: Abdomen is soft.     Tenderness: There is no abdominal tenderness. There is no guarding or rebound.  Musculoskeletal:        General: Normal range of motion.  Cervical back: Normal range of motion and neck supple.  Lymphadenopathy:     Cervical: No cervical adenopathy.  Skin:    General: Skin is warm and dry.     Findings: No rash.     Comments: There is a finding raised papular rash, not erythematous on her neck bilaterally.  No vesicles.  No petechiae or purpura  Neurological:     Mental Status: She is alert and oriented to person, place, and time.    ED Results / Procedures / Treatments   Labs (all labs ordered are listed, but only abnormal results are displayed) Labs Reviewed - No data to display  EKG None  Radiology No results found.  Procedures Procedures    Medications Ordered in ED Medications - No data to display  ED Course/ Medical Decision Making/ A&P                           Medical Decision Making Risk Prescription drug management.   Patient presents with a pruritic rash to her neck.  It appears to be some sort of contact  dermatitis.  There is no vesicles that would be more concerning for shingles.  No signs of infection.  We will start her on hydrocortisone cream and Atarax for itching.  She has an upcoming appointment in March with an allergist.  Return precautions were given.  She was advised not to use the hydrocortisone cream on her face.  {Final Clinical Impression(s) / ED Diagnoses Final diagnoses:  Contact dermatitis, unspecified contact dermatitis type, unspecified trigger    Rx / DC Orders ED Discharge Orders          Ordered    hydrOXYzine (ATARAX) 25 MG tablet  Every 8 hours PRN        03/11/21 0713    hydrocortisone 1 % ointment  2 times daily        03/11/21 0713              Malvin Johns, MD 03/11/21 [phone]

## 2021-03-11 NOTE — ED Triage Notes (Signed)
For a week, patient has had a rash under her neck, face. Patient has allergy appointment until march but says she cannot wait that long. She said she was going to have a fit the itching would not stop. She tried an ice pack that soothed it. Patient has used same detergent, she has begun tracking her food.

## 2021-04-20 ENCOUNTER — Other Ambulatory Visit: Payer: Self-pay

## 2021-04-20 ENCOUNTER — Ambulatory Visit: Payer: Medicare Other | Admitting: Allergy

## 2021-04-20 ENCOUNTER — Encounter: Payer: Self-pay | Admitting: Allergy

## 2021-04-20 VITALS — BP 130/84 | HR 94 | Temp 97.7°F | Resp 16 | Ht 65.0 in | Wt 165.5 lb

## 2021-04-20 DIAGNOSIS — L2489 Irritant contact dermatitis due to other agents: Secondary | ICD-10-CM | POA: Diagnosis not present

## 2021-04-20 DIAGNOSIS — T781XXD Other adverse food reactions, not elsewhere classified, subsequent encounter: Secondary | ICD-10-CM

## 2021-04-20 DIAGNOSIS — T781XXA Other adverse food reactions, not elsewhere classified, initial encounter: Secondary | ICD-10-CM

## 2021-04-20 DIAGNOSIS — J3089 Other allergic rhinitis: Secondary | ICD-10-CM

## 2021-04-20 NOTE — Patient Instructions (Addendum)
Dermatitis ?-  concern the rash you have developed may be contact dermatitis to the hair.  After removal save some strands of the hair in a ziploc for testing.  ?-  we have discussed patch testing for contact dermatitis evaluation.  Patch testing is best placed on Monday with return to office on Wednesday and Friday of same week for readings.  We perform TRUE test patch panels (see below what it test for) and also would recommend patch testing to the hair thus you would need to bring in the hair sample on the patch placement day.  Once patches are in place they are not to get wet. You can take antihistamines for itching if needed.   ?- for treatment of rash recommend you try Opzelura ointment twice a day as needed for itchy, bumpy rash.  Opzelura is a non-steroid ointment safe to go anywhere on the body for rash control.  If this is effective let us know and a prescription can be provided.  Sample of Opzelura provided today.  ? ?True Test looks for the following sensitivities:  ? ? ? ? ?Adverse food reaction  ?- we have discussed the following in regards to foods: ?  Allergy: food allergy is when you have eaten a food, developed an allergic reaction after eating the food and have IgE to the food (positive food testing either by skin testing or blood testing).  Food allergy could lead to life threatening symptoms ? Sensitivity: occurs when you have IgE to a food (positive food testing either by skin testing or blood testing) but is a food you eat without any issues.  This is not an allergy and we recommend keeping the food in the diet ? Intolerance: this is when you have negative testing by either skin testing or blood testing thus not allergic but the food causes symptoms (like belly pain, bloating, diarrhea etc) with ingestion.  These foods should be avoided to prevent symptoms.   ? ?- testing from 2019 to foods showed mild positivity to pecan, walnuts, hazelnut, sweet potato, nutmeg.  You have been eating these  foods and not noticing any reaction symptoms thus I believe you are sensitized (sensitivity as above) and should keep these foods in the diet.   ? ? - there is concern for the Tumeric and Ginger vitamin you started shortly before the rash developed thus obtaining IgE levels to these ingredients.  Do not take the vitamin until labs return and if negative then you can resume this vitamin if you wish.  ? ?Allergic rhinitis ? - continue avoidance measures for grass, weeds, trees, molds, dust mite, dog, mold ? - recommend you get dust mite encasing for your pillows ? - continue use of cetirizine '10mg'$  daily ? - for nasal congestion/drainage recommend use of nasal steroid spray like Flonase, Rhinocort or Nasacort 2 sprays each nostril daily. Use for 1-2 weeks at a time before stopping once symptoms improve ? ?Schedule for patch test placement ?Follow-up for routine visit in 4-6 months or sooner if needed  ?

## 2021-04-20 NOTE — Progress Notes (Signed)
? ? ?New Patient Note ? ?RE: Suzanne Vasquez MRN: [id number] DOB: 1950-11-23 ?Date of Office Visit: 04/20/2021 ? ?Primary care provider: Scifres, Earlie Server, PA-C ? ?Chief Complaint: rash after eating ? ?History of present illness: ?Suzanne Vasquez is a 71 y.o. female presenting today for evaluation of food allergy.  She is a former patient of the practice with last visit on 10/17/2017 for allergic rhinitis and adverse food reaction by myself. ? ?She returns today with similar symptoms as her initial evaluation in 2019. ?She states about a month ago she broke out in a fine bumpy rash on her face and neck.  She could not figure out what she ate that caused the rash.  The rash is itchy when it occurred. She feels after eating certain foods she gets itchy.  She did go ED for this rash on 03/11/21 and per exam was described "raised papular rash, not erythematous on her neck bilaterally. No vesicles. No petechia or purpura".   It was thought she had a contact dermatitis.  She was prescribed hydroxyzine and aquafor hydrocortisone.  The ointment did help with the rash a little bit.  The hydroxyzine she states really did help with the itch.  ?She started taking a tumeric and ginger vitamin about a month ago and was taking 2 gummies a day.  She did stop taking this.  She does feel like the rash above started after taking the gummies.     ?She did get a sew-in on 03/02/21 and she does feel like the hair felt different.  Shortly after she noted the rash as above.  She is getting the hair removed tomorrow.   ? ?She does eat walnuts and she loves pecans.  She also loves sweet potato.  She does not usually season with nutmeg but states may be in sweet potato that she eats.  These foods have mild reactivity on testing in 2019.     ? ?She does note sneezing when she is around dogs.  She also has had nasal drainage in the past too.   ? ?Review of systems: ?Review of Systems  ?Constitutional: Negative.   ?HENT: Negative.    ?Eyes: Negative.    ?Respiratory: Negative.    ?Cardiovascular: Negative.   ?Gastrointestinal: Negative.   ?Musculoskeletal: Negative.   ?Skin:  Positive for rash.  ?Allergic/Immunologic: Negative.   ?Neurological: Negative.   ? ?All other systems negative unless noted above in HPI ? ?Past medical history: ?Past Medical History:  ?Diagnosis Date  ? Allergy   ? seasonal  ? Anemia   ? Blood transfusion without reported diagnosis   ? about "40" years ago  ? Degenerative disc disease, lumbar   ? Depression   ? GERD (gastroesophageal reflux disease)   ? Hypercholesteremia   ? Osteoporosis   ? Seasonal allergies   ? ? ?Past surgical history: ?Past Surgical History:  ?Procedure Laterality Date  ? ABDOMINAL HYSTERECTOMY  1983  ? CARPAL TUNNEL RELEASE  2004  ? ? ?Family history:  ?Family History  ?Problem Relation Age of Onset  ? Diabetes Mother   ? Heart failure Mother   ? Diabetes Brother   ? Eczema Brother   ? Hypertension Brother   ? Eczema Son   ? Colon cancer Neg Hx   ? Breast cancer Neg Hx   ? ? ?Social history: ?Lives in a townhome without carpeting with electric heating and central cooling.  No pets in the home.  There is no concern for water damage,  mildew or roaches in the home.  She is a caregiver.  She does not report a smoking history. ? ? ?Medication List: ?Current Outpatient Medications  ?Medication Sig Dispense Refill  ? Biotin 5000 MCG CAPS Take 1 capsule by mouth daily.    ? cetirizine (ZYRTEC) 10 MG tablet Take 10 mg by mouth daily.    ? cholecalciferol (VITAMIN D) 1000 units tablet Take 1,000 Units by mouth daily.    ? hydrocortisone 1 % ointment Apply 1 application topically 2 (two) times daily. 30 g 0  ? hydrOXYzine (ATARAX) 25 MG tablet Take 1 tablet (25 mg total) by mouth every 8 (eight) hours as needed for itching. 20 tablet 0  ? simvastatin (ZOCOR) 20 MG tablet Take 20 mg by mouth every morning.     ? ?Current Facility-Administered Medications  ?Medication Dose Route Frequency Provider Last Rate Last Admin  ? 0.9 %   sodium chloride infusion  500 mL Intravenous Continuous Milus Banister, MD      ? ? ?Known medication allergies: ?No Known Allergies ? ? ?Physical examination: ?Blood pressure 130/84, pulse 94, temperature 97.7 ?F (36.5 ?C), resp. rate 16, height '5\' 5"'$  (1.651 m), weight 165 lb 8 oz (75.1 kg), SpO2 97 %. ? ?General: Alert, interactive, in no acute distress. ?HEENT: PERRLA, TMs pearly gray, turbinates non-edematous without discharge, post-pharynx non erythematous. ?Neck: Supple without lymphadenopathy. ?Lungs: Clear to auscultation without wheezing, rhonchi or rales. {no increased work of breathing. ?CV: Normal S1, S2 without murmurs. ?Abdomen: Nondistended, nontender. ?Skin: Fine papules on neck under right ear extending to medial neck . ?Extremities:  No clubbing, cyanosis or edema. ?Neuro:   Grossly intact. ? ?Diagnositics/Labs: ?None today ? ?Assessment and plan: ?Dermatitis ?-  concern the rash you have developed may be contact dermatitis to the hair.  After removal save some strands of the hair in a ziploc for testing.  ?-  we have discussed patch testing for contact dermatitis evaluation.  Patch testing is best placed on Monday with return to office on Wednesday and Friday of same week for readings.  We perform TRUE test patch panels and also would recommend patch testing to the hair thus you would need to bring in the hair sample on the patch placement day.  Once patches are in place they are not to get wet. You can take antihistamines for itching if needed.   ?- for treatment of rash recommend you try Opzelura ointment twice a day as needed for itchy, bumpy rash.  Opzelura is a non-steroid ointment safe to go anywhere on the body for rash control.  If this is effective let us know and a prescription can be provided.  Sample of Opzelura provided today.  ? ?Adverse food reaction  ?- we have discussed the following in regards to foods: ?  Allergy: food allergy is when you have eaten a food, developed an  allergic reaction after eating the food and have IgE to the food (positive food testing either by skin testing or blood testing).  Food allergy could lead to life threatening symptoms ? Sensitivity: occurs when you have IgE to a food (positive food testing either by skin testing or blood testing) but is a food you eat without any issues.  This is not an allergy and we recommend keeping the food in the diet ? Intolerance: this is when you have negative testing by either skin testing or blood testing thus not allergic but the food causes symptoms (like belly pain, bloating, diarrhea  etc) with ingestion.  These foods should be avoided to prevent symptoms.   ? ?- testing from 2019 to foods showed mild positivity to pecan, walnuts, hazelnut, sweet potato, nutmeg.  You have been eating these foods and not noticing any reaction symptoms thus I believe you are sensitized (sensitivity as above) and should keep these foods in the diet.   ? ? - there is concern for the Tumeric and Ginger vitamin you started shortly before the rash developed thus obtaining IgE levels to these ingredients.  Do not take the vitamin until labs return and if negative then you can resume this vitamin if you wish.  ? ?Allergic rhinitis ? - continue avoidance measures for grass, weeds, trees, molds, dust mite, dog, mold ? - recommend you get dust mite encasing for your pillows ? - continue use of cetirizine '10mg'$  daily ? - for nasal congestion/drainage recommend use of nasal steroid spray like Flonase, Rhinocort or Nasacort 2 sprays each nostril daily. Use for 1-2 weeks at a time before stopping once symptoms improve ? ?Schedule for patch test placement ?Follow-up for routine visit in 4-6 months or sooner if needed  ? ?I appreciate the opportunity to take part in [facility] care. Please do not hesitate to contact me with questions. ? ?Sincerely, ? ? ?Prudy Feeler, MD ?Allergy/Immunology ?[facility] ?

## 2021-04-26 LAB — ALLERGEN, GINGER, RF270: Allergen Ginger IgE: <0.1 kU/L

## 2021-04-26 LAB — ALLERGEN, TURMERIC, IGE
Class Interpretation: 0
Turmeric IgE*: <0.35 kU/L (ref <0.35)

## 2021-06-15 ENCOUNTER — Other Ambulatory Visit: Payer: Medicare Other

## 2021-06-21 ENCOUNTER — Encounter: Payer: Self-pay | Admitting: Gastroenterology

## 2021-06-26 DIAGNOSIS — E785 Hyperlipidemia, unspecified: Secondary | ICD-10-CM | POA: Diagnosis not present

## 2021-06-26 DIAGNOSIS — E1169 Type 2 diabetes mellitus with other specified complication: Secondary | ICD-10-CM | POA: Diagnosis not present

## 2021-06-26 DIAGNOSIS — M8588 Other specified disorders of bone density and structure, other site: Secondary | ICD-10-CM | POA: Diagnosis not present

## 2021-08-29 ENCOUNTER — Ambulatory Visit: Payer: Medicare Other | Admitting: Allergy

## 2021-09-13 ENCOUNTER — Other Ambulatory Visit: Payer: Self-pay | Admitting: Urgent Care

## 2021-09-13 ENCOUNTER — Other Ambulatory Visit: Payer: Self-pay | Admitting: Family Medicine

## 2021-09-13 DIAGNOSIS — Z1231 Encounter for screening mammogram for malignant neoplasm of breast: Secondary | ICD-10-CM

## 2021-10-01 ENCOUNTER — Ambulatory Visit
Admission: EM | Admit: 2021-10-01 | Discharge: 2021-10-01 | Disposition: A | Discharge status: Home / Self Care | Payer: Medicare Other | Attending: Internal Medicine | Admitting: Internal Medicine

## 2021-10-01 ENCOUNTER — Ambulatory Visit (INDEPENDENT_AMBULATORY_CARE_PROVIDER_SITE_OTHER): Payer: Medicare Other

## 2021-10-01 DIAGNOSIS — S39012A Strain of muscle, fascia and tendon of lower back, initial encounter: Secondary | ICD-10-CM

## 2021-10-01 DIAGNOSIS — M25511 Pain in right shoulder: Secondary | ICD-10-CM | POA: Diagnosis not present

## 2021-10-01 DIAGNOSIS — M546 Pain in thoracic spine: Secondary | ICD-10-CM

## 2021-10-01 DIAGNOSIS — M25512 Pain in left shoulder: Secondary | ICD-10-CM | POA: Diagnosis not present

## 2021-10-01 DIAGNOSIS — M545 Low back pain, unspecified: Secondary | ICD-10-CM | POA: Diagnosis not present

## 2021-10-01 DIAGNOSIS — S46911A Strain of unspecified muscle, fascia and tendon at shoulder and upper arm level, right arm, initial encounter: Secondary | ICD-10-CM | POA: Diagnosis not present

## 2021-10-01 DIAGNOSIS — S46919A Strain of unspecified muscle, fascia and tendon at shoulder and upper arm level, unspecified arm, initial encounter: Secondary | ICD-10-CM

## 2021-10-01 DIAGNOSIS — S46912A Strain of unspecified muscle, fascia and tendon at shoulder and upper arm level, left arm, initial encounter: Secondary | ICD-10-CM | POA: Diagnosis not present

## 2021-10-01 MED ORDER — TRAMADOL HCL 50 MG PO TABS: 50.0000 mg | ORAL_TABLET | Freq: Four times a day (QID) | ORAL | 0 refills | Status: AC | PRN

## 2021-10-01 MED ORDER — BACLOFEN 10 MG PO TABS: 10.0000 mg | ORAL_TABLET | Freq: Three times a day (TID) | ORAL | 0 refills | Status: AC

## 2021-10-01 NOTE — Discharge Instructions (Signed)
Follow up with orthopedics this week, they can order physical therapy for you
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
## 2021-10-22 DIAGNOSIS — E119 Type 2 diabetes mellitus without complications: Secondary | ICD-10-CM | POA: Diagnosis not present

## 2021-11-20 DIAGNOSIS — H5203 Hypermetropia, bilateral: Secondary | ICD-10-CM | POA: Diagnosis not present

## 2021-11-28 ENCOUNTER — Ambulatory Visit
Admission: RE | Admit: 2021-11-28 | Discharge: 2021-11-28 | Disposition: A | Discharge status: Home / Self Care | Payer: Medicare Other | Source: Ambulatory Visit | Attending: Physician Assistant | Admitting: Physician Assistant

## 2021-11-28 DIAGNOSIS — Z780 Asymptomatic menopausal state: Secondary | ICD-10-CM | POA: Diagnosis not present

## 2021-11-28 DIAGNOSIS — M85851 Other specified disorders of bone density and structure, right thigh: Secondary | ICD-10-CM | POA: Diagnosis not present

## 2021-11-28 DIAGNOSIS — M8580 Other specified disorders of bone density and structure, unspecified site: Secondary | ICD-10-CM

## 2021-12-06 ENCOUNTER — Ambulatory Visit: Payer: Medicare Other | Admitting: Allergy

## 2021-12-13 ENCOUNTER — Ambulatory Visit
Admission: RE | Admit: 2021-12-13 | Discharge: 2021-12-13 | Disposition: A | Discharge status: Home / Self Care | Payer: Medicare Other | Source: Ambulatory Visit | Attending: Family Medicine | Admitting: Family Medicine

## 2021-12-13 DIAGNOSIS — Z1231 Encounter for screening mammogram for malignant neoplasm of breast: Secondary | ICD-10-CM | POA: Diagnosis not present

## 2022-02-01 DIAGNOSIS — R21 Rash and other nonspecific skin eruption: Secondary | ICD-10-CM | POA: Diagnosis not present

## 2022-02-01 DIAGNOSIS — E559 Vitamin D deficiency, unspecified: Secondary | ICD-10-CM | POA: Diagnosis not present

## 2022-02-01 DIAGNOSIS — Z23 Encounter for immunization: Secondary | ICD-10-CM | POA: Diagnosis not present

## 2022-02-01 DIAGNOSIS — Z1211 Encounter for screening for malignant neoplasm of colon: Secondary | ICD-10-CM | POA: Diagnosis not present

## 2022-02-01 DIAGNOSIS — E785 Hyperlipidemia, unspecified: Secondary | ICD-10-CM | POA: Diagnosis not present

## 2022-02-01 DIAGNOSIS — Z0000 Encounter for general adult medical examination without abnormal findings: Secondary | ICD-10-CM | POA: Diagnosis not present

## 2022-02-01 DIAGNOSIS — M8588 Other specified disorders of bone density and structure, other site: Secondary | ICD-10-CM | POA: Diagnosis not present

## 2022-02-01 DIAGNOSIS — E1169 Type 2 diabetes mellitus with other specified complication: Secondary | ICD-10-CM | POA: Diagnosis not present

## 2022-02-22 ENCOUNTER — Ambulatory Visit: Payer: Medicare Other | Admitting: Allergy

## 2022-04-04 DIAGNOSIS — L700 Acne vulgaris: Secondary | ICD-10-CM | POA: Diagnosis not present

## 2022-04-04 DIAGNOSIS — L738 Other specified follicular disorders: Secondary | ICD-10-CM | POA: Diagnosis not present

## 2022-04-04 DIAGNOSIS — L810 Postinflammatory hyperpigmentation: Secondary | ICD-10-CM | POA: Diagnosis not present

## 2022-04-10 DIAGNOSIS — N390 Urinary tract infection, site not specified: Secondary | ICD-10-CM | POA: Diagnosis not present

## 2022-05-01 DIAGNOSIS — R8290 Unspecified abnormal findings in urine: Secondary | ICD-10-CM | POA: Diagnosis not present

## 2022-05-01 DIAGNOSIS — L219 Seborrheic dermatitis, unspecified: Secondary | ICD-10-CM | POA: Diagnosis not present

## 2022-05-01 DIAGNOSIS — Z87440 Personal history of urinary (tract) infections: Secondary | ICD-10-CM | POA: Diagnosis not present

## 2022-05-01 DIAGNOSIS — L700 Acne vulgaris: Secondary | ICD-10-CM | POA: Diagnosis not present

## 2022-05-01 DIAGNOSIS — L810 Postinflammatory hyperpigmentation: Secondary | ICD-10-CM | POA: Diagnosis not present

## 2022-05-29 DIAGNOSIS — N3000 Acute cystitis without hematuria: Secondary | ICD-10-CM | POA: Diagnosis not present

## 2022-05-29 DIAGNOSIS — Z111 Encounter for screening for respiratory tuberculosis: Secondary | ICD-10-CM | POA: Diagnosis not present

## 2022-05-29 DIAGNOSIS — R350 Frequency of micturition: Secondary | ICD-10-CM | POA: Diagnosis not present

## 2022-06-24 DIAGNOSIS — N390 Urinary tract infection, site not specified: Secondary | ICD-10-CM | POA: Diagnosis not present

## 2022-07-31 DIAGNOSIS — M8588 Other specified disorders of bone density and structure, other site: Secondary | ICD-10-CM | POA: Diagnosis not present

## 2022-07-31 DIAGNOSIS — E1136 Type 2 diabetes mellitus with diabetic cataract: Secondary | ICD-10-CM | POA: Diagnosis not present

## 2022-07-31 DIAGNOSIS — N390 Urinary tract infection, site not specified: Secondary | ICD-10-CM | POA: Diagnosis not present

## 2022-07-31 DIAGNOSIS — E1169 Type 2 diabetes mellitus with other specified complication: Secondary | ICD-10-CM | POA: Diagnosis not present

## 2022-07-31 DIAGNOSIS — R21 Rash and other nonspecific skin eruption: Secondary | ICD-10-CM | POA: Diagnosis not present

## 2022-07-31 DIAGNOSIS — E559 Vitamin D deficiency, unspecified: Secondary | ICD-10-CM | POA: Diagnosis not present

## 2022-07-31 DIAGNOSIS — E785 Hyperlipidemia, unspecified: Secondary | ICD-10-CM | POA: Diagnosis not present

## 2022-10-23 DIAGNOSIS — E119 Type 2 diabetes mellitus without complications: Secondary | ICD-10-CM | POA: Diagnosis not present

## 2022-10-31 ENCOUNTER — Other Ambulatory Visit: Payer: Self-pay | Admitting: Family Medicine

## 2022-10-31 DIAGNOSIS — Z1231 Encounter for screening mammogram for malignant neoplasm of breast: Secondary | ICD-10-CM

## 2022-11-22 DIAGNOSIS — N390 Urinary tract infection, site not specified: Secondary | ICD-10-CM | POA: Diagnosis not present

## 2022-11-22 DIAGNOSIS — Z0100 Encounter for examination of eyes and vision without abnormal findings: Secondary | ICD-10-CM | POA: Diagnosis not present

## 2022-12-16 ENCOUNTER — Ambulatory Visit: Payer: Medicare Other

## 2023-01-07 DIAGNOSIS — Z8249 Family history of ischemic heart disease and other diseases of the circulatory system: Secondary | ICD-10-CM | POA: Diagnosis not present

## 2023-01-07 DIAGNOSIS — I129 Hypertensive chronic kidney disease with stage 1 through stage 4 chronic kidney disease, or unspecified chronic kidney disease: Secondary | ICD-10-CM | POA: Diagnosis not present

## 2023-01-07 DIAGNOSIS — Z008 Encounter for other general examination: Secondary | ICD-10-CM | POA: Diagnosis not present

## 2023-01-07 DIAGNOSIS — E785 Hyperlipidemia, unspecified: Secondary | ICD-10-CM | POA: Diagnosis not present

## 2023-01-07 DIAGNOSIS — Z87440 Personal history of urinary (tract) infections: Secondary | ICD-10-CM | POA: Diagnosis not present

## 2023-01-07 DIAGNOSIS — N189 Chronic kidney disease, unspecified: Secondary | ICD-10-CM | POA: Diagnosis not present

## 2023-01-07 DIAGNOSIS — M810 Age-related osteoporosis without current pathological fracture: Secondary | ICD-10-CM | POA: Diagnosis not present

## 2023-01-07 DIAGNOSIS — J301 Allergic rhinitis due to pollen: Secondary | ICD-10-CM | POA: Diagnosis not present

## 2023-01-07 DIAGNOSIS — Z833 Family history of diabetes mellitus: Secondary | ICD-10-CM | POA: Diagnosis not present

## 2023-01-07 DIAGNOSIS — F325 Major depressive disorder, single episode, in full remission: Secondary | ICD-10-CM | POA: Diagnosis not present

## 2023-01-30 ENCOUNTER — Ambulatory Visit
Admission: RE | Admit: 2023-01-30 | Discharge: 2023-01-30 | Disposition: A | Discharge status: Home / Self Care | Payer: Medicare HMO | Source: Ambulatory Visit | Attending: Family Medicine | Admitting: Family Medicine

## 2023-01-30 DIAGNOSIS — Z1231 Encounter for screening mammogram for malignant neoplasm of breast: Secondary | ICD-10-CM

## 2023-04-09 DIAGNOSIS — R350 Frequency of micturition: Secondary | ICD-10-CM | POA: Diagnosis not present

## 2023-04-09 DIAGNOSIS — L0292 Furuncle, unspecified: Secondary | ICD-10-CM | POA: Diagnosis not present

## 2023-04-09 DIAGNOSIS — J069 Acute upper respiratory infection, unspecified: Secondary | ICD-10-CM | POA: Diagnosis not present

## 2023-05-09 DIAGNOSIS — R399 Unspecified symptoms and signs involving the genitourinary system: Secondary | ICD-10-CM | POA: Diagnosis not present

## 2023-05-09 DIAGNOSIS — J309 Allergic rhinitis, unspecified: Secondary | ICD-10-CM | POA: Diagnosis not present

## 2023-05-19 DIAGNOSIS — Z0100 Encounter for examination of eyes and vision without abnormal findings: Secondary | ICD-10-CM | POA: Diagnosis not present

## 2023-07-07 ENCOUNTER — Encounter: Payer: Self-pay | Admitting: Gastroenterology

## 2023-07-14 DIAGNOSIS — R399 Unspecified symptoms and signs involving the genitourinary system: Secondary | ICD-10-CM | POA: Diagnosis not present

## 2023-07-14 DIAGNOSIS — N390 Urinary tract infection, site not specified: Secondary | ICD-10-CM | POA: Diagnosis not present

## 2023-07-14 DIAGNOSIS — R030 Elevated blood-pressure reading, without diagnosis of hypertension: Secondary | ICD-10-CM | POA: Diagnosis not present

## 2023-08-18 ENCOUNTER — Telehealth: Payer: Self-pay

## 2023-08-18 ENCOUNTER — Encounter

## 2023-08-18 NOTE — Telephone Encounter (Signed)
 No Show, left multiple messages.  Let patient know that I had tried to reach her on her appt.  Time but unable to do the Previsit and that she needed to call back and reschedule the previsit by 4pm today or her colonoscopy would be cancelled.

## 2023-08-19 ENCOUNTER — Encounter

## 2023-08-19 ENCOUNTER — Telehealth: Payer: Self-pay

## 2023-08-19 DIAGNOSIS — E1136 Type 2 diabetes mellitus with diabetic cataract: Secondary | ICD-10-CM | POA: Diagnosis not present

## 2023-08-19 DIAGNOSIS — M8580 Other specified disorders of bone density and structure, unspecified site: Secondary | ICD-10-CM | POA: Diagnosis not present

## 2023-08-19 DIAGNOSIS — E785 Hyperlipidemia, unspecified: Secondary | ICD-10-CM | POA: Diagnosis not present

## 2023-08-19 DIAGNOSIS — E1169 Type 2 diabetes mellitus with other specified complication: Secondary | ICD-10-CM | POA: Diagnosis not present

## 2023-08-19 DIAGNOSIS — E559 Vitamin D deficiency, unspecified: Secondary | ICD-10-CM | POA: Diagnosis not present

## 2023-08-19 DIAGNOSIS — N390 Urinary tract infection, site not specified: Secondary | ICD-10-CM | POA: Diagnosis not present

## 2023-08-19 NOTE — Telephone Encounter (Signed)
 Patient called back and rescheduled for 4pm on 08/20/23

## 2023-08-19 NOTE — Telephone Encounter (Signed)
 NO SHOW.  Called back and left message letting the patient know that she missed her pre visit appointment and that she needed to call back by 4pm today or the colonoscopy would be cancelled.

## 2023-08-19 NOTE — Telephone Encounter (Signed)
 No show, No answer.  Left message for patient that I was trying to reach her to complete her pre visit.  Will call back in 5 minutes.

## 2023-08-20 ENCOUNTER — Ambulatory Visit

## 2023-08-20 ENCOUNTER — Encounter: Payer: Self-pay | Admitting: Gastroenterology

## 2023-08-20 VITALS — Ht 63.0 in | Wt 162.0 lb

## 2023-08-20 DIAGNOSIS — Z860100 Personal history of colon polyps, unspecified: Secondary | ICD-10-CM

## 2023-08-20 MED ORDER — NA SULFATE-K SULFATE-MG SULF 17.5-3.13-1.6 GM/177ML PO SOLN: 1.0000 | Freq: Once | ORAL | 0 refills | Status: AC

## 2023-08-20 NOTE — Progress Notes (Signed)

## 2023-08-22 ENCOUNTER — Encounter: Payer: Self-pay | Admitting: Advanced Practice Midwife

## 2023-08-27 NOTE — Telephone Encounter (Signed)
Completed PV

## 2023-09-01 ENCOUNTER — Ambulatory Visit (AMBULATORY_SURGERY_CENTER): Admitting: Gastroenterology

## 2023-09-01 ENCOUNTER — Encounter: Payer: Self-pay | Admitting: Gastroenterology

## 2023-09-01 VITALS — BP 121/74 | HR 72 | Temp 98.0°F | Resp 11 | Ht 63.0 in | Wt 162.0 lb

## 2023-09-01 DIAGNOSIS — K648 Other hemorrhoids: Secondary | ICD-10-CM | POA: Diagnosis not present

## 2023-09-01 DIAGNOSIS — F32A Depression, unspecified: Secondary | ICD-10-CM | POA: Diagnosis not present

## 2023-09-01 DIAGNOSIS — Z860101 Personal history of adenomatous and serrated colon polyps: Secondary | ICD-10-CM

## 2023-09-01 DIAGNOSIS — Z860100 Personal history of colon polyps, unspecified: Secondary | ICD-10-CM

## 2023-09-01 DIAGNOSIS — Z1211 Encounter for screening for malignant neoplasm of colon: Secondary | ICD-10-CM

## 2023-09-01 DIAGNOSIS — K5730 Diverticulosis of large intestine without perforation or abscess without bleeding: Secondary | ICD-10-CM | POA: Diagnosis not present

## 2023-09-01 DIAGNOSIS — E7800 Pure hypercholesterolemia, unspecified: Secondary | ICD-10-CM | POA: Diagnosis not present

## 2023-09-01 MED ORDER — SODIUM CHLORIDE 0.9 % IV SOLN: 500.0000 mL | INTRAVENOUS | Status: DC

## 2023-09-01 NOTE — Progress Notes (Signed)
 Patient states there have been no changes to medical or surgical history since time of pre-visit.

## 2023-09-01 NOTE — Op Note (Signed)
 [facility] Patient Name: Suzanne Vasquez Procedure Date: 09/01/2023 10:50 AM MRN: [id number] Endoscopist: Victory L. Legrand , MD, [id number] Age: 73 Referring MD:  Date of Birth: [birth year] Gender: Female Account #: [id number] Procedure:                Colonoscopy Indications:              Surveillance: Personal history of adenomatous                            polyps on last colonoscopy > 5 years ago                           7mm TA June 2018 (Dr Teressa) Medicines:                Monitored Anesthesia Care Procedure:                Pre-Anesthesia Assessment:                           - Prior to the procedure, a History and Physical                            was performed, and patient medications and                            allergies were reviewed. The patient's tolerance of                            previous anesthesia was also reviewed. The risks                            and benefits of the procedure and the sedation                            options and risks were discussed with the patient.                            All questions were answered, and informed consent                            was obtained. Prior Anticoagulants: The patient has                            taken no anticoagulant or antiplatelet agents. ASA                            Grade Assessment: II - A patient with mild systemic                            disease. After reviewing the risks and benefits,                            the patient was deemed in satisfactory condition to  undergo the procedure.                           After obtaining informed consent, the colonoscope                            was passed under direct vision. Throughout the                            procedure, the patient's blood pressure, pulse, and                            oxygen saturations were monitored continuously. The                            CF [id number] #[id number] was introduced through  the anus                            and advanced to the the terminal ileum, with                            identification of the appendiceal orifice and IC                            valve. The colonoscopy was performed without                            difficulty. The patient tolerated the procedure                            well. The quality of the bowel preparation was                            good. The terminal ileum, ileocecal valve,                            appendiceal orifice, and rectum were photographed. Scope In: 11:13:19 AM Scope Out: 11:25:12 AM Scope Withdrawal Time: 0 hours 8 minutes 47 seconds  Total Procedure Duration: 0 hours 11 minutes 53 seconds  Findings:                 The perianal and digital rectal examinations were                            normal.                           The terminal ileum appeared normal.                           Repeat examination of right colon under NBI                            performed.  A few small-mouthed diverticula were found in the                            left colon.                           Internal hemorrhoids were found. The hemorrhoids                            were medium-sized.                           The exam was otherwise without abnormality on                            direct and retroflexion views. Complications:            No immediate complications. Estimated Blood Loss:     Estimated blood loss: none. Impression:               - The examined portion of the ileum was normal.                           - Diverticulosis in the left colon.                           - Internal hemorrhoids.                           - The examination was otherwise normal on direct                            and retroflexion views.                           - No specimens collected. Recommendation:           - Patient has a contact number available for                            emergencies. The  signs and symptoms of potential                            delayed complications were discussed with the                            patient. Return to normal activities tomorrow.                            Written discharge instructions were provided to the                            patient.                           - Resume previous diet.                           -  Continue present medications.                           - No repeat surveillance colonoscopy due to age,                            current guidelines and low risk findings today. [name] L. Legrand, MD 09/01/2023 11:29:36 AM This report has been signed electronically.

## 2023-09-01 NOTE — Progress Notes (Signed)
 History and Physical:  This patient presents for endoscopic testing for: Encounter Diagnosis  Name Primary?   History of colonic polyps Yes    Surveillance colonoscopy today for Hx colon polyps.  7mm Sigmoid colon TA on last colonoscopy June 2018 (Dr. Teressa)  Patient denies chronic abdominal pain, rectal bleeding, constipation or diarrhea.   Patient is otherwise without complaints or active issues today.   Past Medical History: Past Medical History:  Diagnosis Date   Allergy    seasonal   Anemia    Blood transfusion without reported diagnosis    about 40 years ago   Degenerative disc disease, lumbar    Depression    GERD (gastroesophageal reflux disease)    Hypercholesteremia    Osteoporosis    Seasonal allergies      Past Surgical History: Past Surgical History:  Procedure Laterality Date   ABDOMINAL HYSTERECTOMY  1983   CARPAL TUNNEL RELEASE  2004    Allergies: No Known Allergies  Outpatient Meds: Current Outpatient Medications  Medication Sig Dispense Refill   Calcium 200 MG TABS Calcium     cetirizine (ZYRTEC) 10 MG tablet Take 10 mg by mouth daily.     cholecalciferol (VITAMIN D) 1000 units tablet Take 1,000 Units by mouth daily.     CINNAMON PO Take by mouth.     Cranberry 240 MG CAPS Cranberry     fluticasone (FLONASE) 50 MCG/ACT nasal spray Place into both nostrils.     Misc Natural Products (BEET ROOT PO) Take by mouth.     Misc Natural Products (MAGIC MUSHROOM MIX PO) Take by mouth.     naproxen sodium (ALEVE) 220 MG tablet Take 220 mg by mouth daily as needed.     Omega 3 1000 MG CAPS with DHA 2 gummies Orally Once a day     simvastatin (ZOCOR) 20 MG tablet Take 20 mg by mouth every morning.      baclofen  (LIORESAL ) 10 MG tablet Take 1 tablet (10 mg total) by mouth 3 (three) times daily. For muscle spasm (Patient not taking: No sig reported) 30 each 0   traMADol  (ULTRAM ) 50 MG tablet Take 1 tablet (50 mg total) by mouth every 6 (six) hours as  needed. (Patient not taking: No sig reported) 10 tablet 0   Current Facility-Administered Medications  Medication Dose Route Frequency Provider Last Rate Last Admin   0.9 %  sodium chloride  infusion  500 mL Intravenous Continuous Danis, Victory CROME III, MD          ___________________________________________________________________ Objective   Exam:  BP 135/84   Pulse 83   Temp 98 F (36.7 C) (Skin)   Ht 5' 3 (1.6 m)   Wt 162 lb (73.5 kg)   SpO2 98%   BMI 28.70 kg/m   CV: regular , S1/S2 Resp: clear to auscultation bilaterally, normal RR and effort noted GI: soft, no tenderness, with active bowel sounds.   Assessment: Encounter Diagnosis  Name Primary?   History of colonic polyps Yes     Plan: Colonoscopy   The benefits and risks of the planned procedure(s) were described in detail with the patient or (when appropriate) their health care proxy.  Risks were outlined as including, but not limited to, bleeding, infection, perforation, adverse medication reaction leading to cardiac or pulmonary decompensation, pancreatitis (if ERCP).  The limitation of incomplete mucosal visualization was also discussed.  No guarantees or warranties were given.  The patient is appropriate for an endoscopic procedure in the ambulatory  setting.   - Victory Brand, MD

## 2023-09-01 NOTE — Patient Instructions (Signed)
 YOU HAD AN ENDOSCOPIC PROCEDURE TODAY AT THE [facility]:   Refer to the procedure report that was given to you for any specific questions about what was found during the examination.  If the procedure report does not answer your questions, please call your gastroenterologist to clarify.  If you requested that your care partner not be given the details of your procedure findings, then the procedure report has been included in a sealed envelope for you to review at your convenience later.  YOU SHOULD EXPECT: Some feelings of bloating in the abdomen. Passage of more gas than usual.  Walking can help get rid of the air that was put into your GI tract during the procedure and reduce the bloating. If you had a lower endoscopy (such as a colonoscopy or flexible sigmoidoscopy) you may notice spotting of blood in your stool or on the toilet paper. If you underwent a bowel prep for your procedure, you may not have a normal bowel movement for a few days.  Please Note:  You might notice some irritation and congestion in your nose or some drainage.  This is from the oxygen used during your procedure.  There is no need for concern and it should clear up in a day or so.  SYMPTOMS TO REPORT IMMEDIATELY:  Following lower endoscopy (colonoscopy or flexible sigmoidoscopy):  Excessive amounts of blood in the stool  Significant tenderness or worsening of abdominal pains  Swelling of the abdomen that is new, acute  Fever of 100F or higher   Resume previous diet Continue present medications No repeat colonoscopy due to age   For urgent or emergent issues, a gastroenterologist can be reached at any hour by calling [phone]. Do not use MyChart messaging for urgent concerns.    DIET:  We do recommend a small meal at first, but then you may proceed to your regular diet.  Drink plenty of fluids but you should avoid alcoholic beverages for 24 hours.  ACTIVITY:  You should plan to take it easy for  the rest of today and you should NOT DRIVE or use heavy machinery until tomorrow (because of the sedation medicines used during the test).    FOLLOW UP: Our staff will call the number listed on your records the next business day following your procedure.  We will call around 7:15- 8:00 am to check on you and address any questions or concerns that you may have regarding the information given to you following your procedure. If we do not reach you, we will leave a message.     If any biopsies were taken you will be contacted by phone or by letter within the next 1-3 weeks.  Please call us  at [phone] if you have not heard about the biopsies in 3 weeks.    SIGNATURES/CONFIDENTIALITY: You and/or your care partner have signed paperwork which will be entered into your electronic medical record.  These signatures attest to the fact that that the information above on your After Visit Summary has been reviewed and is understood.  Full responsibility of the confidentiality of this discharge information lies with you and/or your care-partner.

## 2023-09-01 NOTE — Progress Notes (Signed)
 Pt resting comfortably. VSS. Airway intact. SBAR complete to RN. All questions answered.

## 2023-09-02 ENCOUNTER — Telehealth: Payer: Self-pay | Admitting: *Deleted

## 2023-09-02 DIAGNOSIS — N390 Urinary tract infection, site not specified: Secondary | ICD-10-CM | POA: Diagnosis not present

## 2023-09-02 NOTE — Telephone Encounter (Signed)
  Follow up Call-     09/01/2023   10:03 AM  Call back number  Post procedure Call Back phone  # [phone]  Permission to leave phone message Yes     Patient questions:  Do you have a fever, pain , or abdominal swelling? No. Pain Score  0 *  Have you tolerated food without any problems? Yes.    Have you been able to return to your normal activities? Yes.    Do you have any questions about your discharge instructions: Diet   No. Medications  No. Follow up visit  No.  Do you have questions or concerns about your Care? No.  Actions: * If pain score is 4 or above: No action needed, pain <4.

## 2023-09-04 DIAGNOSIS — E1169 Type 2 diabetes mellitus with other specified complication: Secondary | ICD-10-CM | POA: Diagnosis not present

## 2023-09-04 DIAGNOSIS — E1136 Type 2 diabetes mellitus with diabetic cataract: Secondary | ICD-10-CM | POA: Diagnosis not present

## 2023-09-04 DIAGNOSIS — E785 Hyperlipidemia, unspecified: Secondary | ICD-10-CM | POA: Diagnosis not present

## 2023-10-05 DIAGNOSIS — E785 Hyperlipidemia, unspecified: Secondary | ICD-10-CM | POA: Diagnosis not present

## 2023-10-05 DIAGNOSIS — E1169 Type 2 diabetes mellitus with other specified complication: Secondary | ICD-10-CM | POA: Diagnosis not present

## 2023-10-05 DIAGNOSIS — E1136 Type 2 diabetes mellitus with diabetic cataract: Secondary | ICD-10-CM | POA: Diagnosis not present

## 2023-10-27 DIAGNOSIS — E119 Type 2 diabetes mellitus without complications: Secondary | ICD-10-CM | POA: Diagnosis not present

## 2023-11-04 DIAGNOSIS — E785 Hyperlipidemia, unspecified: Secondary | ICD-10-CM | POA: Diagnosis not present

## 2023-11-04 DIAGNOSIS — E1169 Type 2 diabetes mellitus with other specified complication: Secondary | ICD-10-CM | POA: Diagnosis not present

## 2023-11-04 DIAGNOSIS — E1136 Type 2 diabetes mellitus with diabetic cataract: Secondary | ICD-10-CM | POA: Diagnosis not present

## 2023-11-16 DIAGNOSIS — K649 Unspecified hemorrhoids: Secondary | ICD-10-CM | POA: Diagnosis not present

## 2023-12-05 DIAGNOSIS — E785 Hyperlipidemia, unspecified: Secondary | ICD-10-CM | POA: Diagnosis not present

## 2023-12-05 DIAGNOSIS — E1169 Type 2 diabetes mellitus with other specified complication: Secondary | ICD-10-CM | POA: Diagnosis not present

## 2023-12-05 DIAGNOSIS — E1136 Type 2 diabetes mellitus with diabetic cataract: Secondary | ICD-10-CM | POA: Diagnosis not present

## 2023-12-11 DIAGNOSIS — Z008 Encounter for other general examination: Secondary | ICD-10-CM | POA: Diagnosis not present

## 2023-12-26 ENCOUNTER — Other Ambulatory Visit: Payer: Self-pay | Admitting: Family Medicine

## 2023-12-26 DIAGNOSIS — Z1231 Encounter for screening mammogram for malignant neoplasm of breast: Secondary | ICD-10-CM

## 2024-01-04 DIAGNOSIS — E785 Hyperlipidemia, unspecified: Secondary | ICD-10-CM | POA: Diagnosis not present

## 2024-01-04 DIAGNOSIS — E1136 Type 2 diabetes mellitus with diabetic cataract: Secondary | ICD-10-CM | POA: Diagnosis not present

## 2024-01-04 DIAGNOSIS — E1169 Type 2 diabetes mellitus with other specified complication: Secondary | ICD-10-CM | POA: Diagnosis not present

## 2024-01-07 DIAGNOSIS — Z0120 Encounter for dental examination and cleaning without abnormal findings: Secondary | ICD-10-CM | POA: Diagnosis not present

## 2024-01-16 DIAGNOSIS — R399 Unspecified symptoms and signs involving the genitourinary system: Secondary | ICD-10-CM | POA: Diagnosis not present

## 2024-01-16 DIAGNOSIS — N390 Urinary tract infection, site not specified: Secondary | ICD-10-CM | POA: Diagnosis not present

## 2024-01-16 DIAGNOSIS — H6992 Unspecified Eustachian tube disorder, left ear: Secondary | ICD-10-CM | POA: Diagnosis not present

## 2024-01-16 DIAGNOSIS — J329 Chronic sinusitis, unspecified: Secondary | ICD-10-CM | POA: Diagnosis not present

## 2024-02-03 ENCOUNTER — Ambulatory Visit
Admission: RE | Admit: 2024-02-03 | Discharge: 2024-02-03 | Disposition: A | Discharge status: Home / Self Care | Source: Ambulatory Visit | Attending: Family Medicine | Admitting: Family Medicine

## 2024-02-03 DIAGNOSIS — Z1231 Encounter for screening mammogram for malignant neoplasm of breast: Secondary | ICD-10-CM

## 2024-02-25 ENCOUNTER — Other Ambulatory Visit: Payer: Self-pay | Admitting: Family Medicine

## 2024-02-25 DIAGNOSIS — R222 Localized swelling, mass and lump, trunk: Secondary | ICD-10-CM

## 2024-03-26 ENCOUNTER — Other Ambulatory Visit
